# Patient Record
Sex: Female | Born: 1955 | Race: White | Hispanic: No | Marital: Married | Smoking: Never smoker
Health system: Southern US, Community
[De-identification: ages and names within clinical notes are randomized; demographics above are authoritative.]

## PROBLEM LIST (undated history)

## (undated) DIAGNOSIS — K219 Gastro-esophageal reflux disease without esophagitis: Secondary | ICD-10-CM

## (undated) DIAGNOSIS — T7840XA Allergy, unspecified, initial encounter: Secondary | ICD-10-CM

## (undated) DIAGNOSIS — J40 Bronchitis, not specified as acute or chronic: Secondary | ICD-10-CM

## (undated) HISTORY — PX: NO PAST SURGERIES: SHX2092

## (undated) HISTORY — DX: Allergy, unspecified, initial encounter: T78.40XA

## (undated) HISTORY — PX: CHOLECYSTECTOMY: SHX55

## (undated) HISTORY — PX: ANKLE SURGERY: SHX546

## (undated) HISTORY — DX: Gastro-esophageal reflux disease without esophagitis: K21.9

## (undated) HISTORY — PX: TONSILECTOMY, ADENOIDECTOMY, BILATERAL MYRINGOTOMY AND TUBES: SHX2538

---

## 1973-12-19 HISTORY — PX: TONSILLECTOMY: SUR1361

## 2010-12-19 HISTORY — PX: ANKLE SURGERY: SHX546

## 2019-03-04 ENCOUNTER — Ambulatory Visit (INDEPENDENT_AMBULATORY_CARE_PROVIDER_SITE_OTHER): Payer: Managed Care, Other (non HMO) | Admitting: Nurse Practitioner

## 2019-03-04 ENCOUNTER — Encounter: Payer: Self-pay | Admitting: Nurse Practitioner

## 2019-03-04 ENCOUNTER — Other Ambulatory Visit: Payer: Self-pay | Admitting: Nurse Practitioner

## 2019-03-04 ENCOUNTER — Other Ambulatory Visit: Payer: Self-pay

## 2019-03-04 VITALS — BP 120/62 | HR 79 | Resp 15 | Ht 66.0 in | Wt 199.8 lb

## 2019-03-04 DIAGNOSIS — Z7689 Persons encountering health services in other specified circumstances: Secondary | ICD-10-CM

## 2019-03-04 DIAGNOSIS — M79604 Pain in right leg: Secondary | ICD-10-CM

## 2019-03-04 DIAGNOSIS — Z1283 Encounter for screening for malignant neoplasm of skin: Secondary | ICD-10-CM

## 2019-03-04 DIAGNOSIS — N941 Unspecified dyspareunia: Secondary | ICD-10-CM | POA: Diagnosis not present

## 2019-03-04 DIAGNOSIS — B372 Candidiasis of skin and nail: Secondary | ICD-10-CM

## 2019-03-04 DIAGNOSIS — Z23 Encounter for immunization: Secondary | ICD-10-CM

## 2019-03-04 DIAGNOSIS — K219 Gastro-esophageal reflux disease without esophagitis: Secondary | ICD-10-CM | POA: Diagnosis not present

## 2019-03-04 DIAGNOSIS — J40 Bronchitis, not specified as acute or chronic: Secondary | ICD-10-CM

## 2019-03-04 DIAGNOSIS — Z0000 Encounter for general adult medical examination without abnormal findings: Secondary | ICD-10-CM

## 2019-03-04 MED ORDER — ESOMEPRAZOLE MAGNESIUM 40 MG PO CPDR: 40.0000 mg | DELAYED_RELEASE_CAPSULE | Freq: Every day | ORAL | 3 refills | Status: DC

## 2019-03-04 MED ORDER — ESTROGENS, CONJUGATED 0.625 MG/GM VA CREA: TOPICAL_CREAM | VAGINAL | 12 refills | Status: DC

## 2019-03-04 MED ORDER — ALBUTEROL SULFATE HFA 108 (90 BASE) MCG/ACT IN AERS: 1.0000 | INHALATION_SPRAY | Freq: Four times a day (QID) | RESPIRATORY_TRACT | 2 refills | Status: DC | PRN

## 2019-03-04 NOTE — Patient Instructions (Addendum)
Samantha Whitney,   Thank you for coming in to clinic today.  1. Use an antifungal cream Lotrimin topically twice daily for 10-14 days on ears, and right ring finger.  2. STOP PremPro - Take every 3 days this week, then stop.  3. START Premarin vaginal cream fingertip amount nightly.  May reduce to 2-3 time per week if symptoms of vaginal dryness/irritation are controlled.  4. Dermatology office will call you to schedule your screening.  5. Nexium 40 mg once daily before breakfast by mouth.  Please schedule a follow-up appointment with Cassell Smiles, AGNP. Return in about 6 months (around 09/04/2019) for GERD.  If you have any other questions or concerns, please feel free to call the clinic or send a message through [facility]. You may also schedule an earlier appointment if necessary.  You will receive a survey after today's visit either digitally by e-mail or paper by [employer]. Your experiences and feedback matter to [place].  Please respond so we know how we are doing as we provide care for you.   Cassell Smiles, DNP, AGNP-BC Adult Gerontology Nurse Practitioner [place] [facility]

## 2019-03-04 NOTE — Progress Notes (Signed)
Subjective:    Patient ID: Samantha Whitney, female    DOB: April 16, 1956, 63 y.o.   MRN: [id number]  Samantha Whitney is a 63 y.o. female presenting on 03/04/2019 for Establish Care and right leg pain (02/01/19, patient reports she flies a lot for job. )  HPI [facility] Provider Pt last seen by PCP 2-3 years ago.  Obtain records if needed in future.    RIGHT Leg pain Patient travels frequently for her job at [employer] and had traveled 1 week prior to this event.  Patient started with daily aspirin after this for prior symptoms.  Has had no recurrences.  Has been without symptoms since last trips.  - Recent Travel - last trip about 4 days ago.  No symptoms of URI currently.  GERD - She is not currently symptomatic. Patient has worsening symptoms with trigger foods, so she avoids these. - She reports no n/v, coffee ground emesis, dark/black/tarry stool, BRBPR, or other GI bleeding.  - She is currently taking esomeprazole OTC. - Risk factors present for GERD include obesity.  - No prior EGD.  Bronchitis History: - Patient has had bronchitis with regular need for Zpack and occasional steroid.  Requests consideration of history for future treatment. Previous provider would prescribe this often without OV as patient's symptoms would progress quickly without it per patient report.  Past Medical History:  Diagnosis Date  . Allergy   . GERD (gastroesophageal reflux disease)    Past Surgical History:  Procedure Laterality Date  . NO PAST SURGERIES     Social History   Socioeconomic History  . Marital status: Married    Spouse name: Not on file  . Number of children: Not on file  . Years of education: Not on file  . Highest education level: Bachelor's degree (e.g., BA, AB, BS)  Occupational History    Employer: [employer]  Social Needs  . Financial resource strain: Not on file  . Food insecurity:    Worry: Not on file    Inability: Not on file  . Transportation needs:   Medical: Not on file    Non-medical: Not on file  Tobacco Use  . Smoking status: Never Smoker  . Smokeless tobacco: Never Used  Substance and Sexual Activity  . Alcohol use: Not on file  . Drug use: Never  . Sexual activity: Not on file  Lifestyle  . Physical activity:    Days per week: Not on file    Minutes per session: Not on file  . Stress: Not on file  Relationships  . Social connections:    Talks on phone: Not on file    Gets together: Not on file    Attends religious service: Not on file    Active member of club or organization: Not on file    Attends meetings of clubs or organizations: Not on file    Relationship status: Not on file  . Intimate partner violence:    Fear of current or ex partner: Not on file    Emotionally abused: Not on file    Physically abused: Not on file    Forced sexual activity: Not on file  Other Topics Concern  . Not on file  Social History Narrative  . Not on file   Family History  Problem Relation Age of Onset  . Cancer Mother        ColoRectal  . Cancer Father        Stomach Cancer   Current Outpatient  Medications on File Prior to Visit  Medication Sig  . calcium carbonate (OSCAL) 1500 (600 Ca) MG TABS tablet Take by mouth 2 (two) times daily with a meal.  . naproxen sodium (ALEVE) 220 MG tablet Take 220 mg by mouth daily as needed.   No current facility-administered medications on file prior to visit.    Review of Systems  Constitutional: Negative for activity change, appetite change and fatigue.  HENT: Negative for congestion.   Eyes: Negative for visual disturbance.  Respiratory: Negative for cough and shortness of breath.   Cardiovascular: Negative for chest pain, palpitations and leg swelling.  Gastrointestinal: Negative for constipation, diarrhea, nausea and vomiting.  Endocrine: Negative for cold intolerance, heat intolerance, polyphagia and polyuria.  Genitourinary: Negative for dysuria, frequency and urgency.   Musculoskeletal: Negative for arthralgias and myalgias.  Skin: Positive for rash (fingers - pruritic, flaky, erythematous).  Allergic/Immunologic: Negative for environmental allergies and food allergies.  Neurological: Negative for dizziness and headaches.  Psychiatric/Behavioral: Negative for dysphoric mood. The patient is not nervous/anxious.    Per HPI unless specifically indicated above     Objective:    BP 120/62 (BP Location: Left Arm, Patient Position: Sitting, Cuff Size: Normal)   Pulse 79   Resp 15   Ht 5\' 6"  (1.676 m)   Wt 199 lb 12.8 oz (90.6 kg)   SpO2 97%   BMI 32.25 kg/m   Wt Readings from Last 3 Encounters:  03/04/19 199 lb 12.8 oz (90.6 kg)    Physical Exam Vitals signs reviewed.  Constitutional:      General: She is not in acute distress.    Appearance: She is well-developed.  HENT:     Head: Normocephalic and atraumatic.  Cardiovascular:     Rate and Rhythm: Normal rate and regular rhythm.     Pulses:          Radial pulses are 2+ on the right side and 2+ on the left side.       Posterior tibial pulses are 1+ on the right side and 1+ on the left side.     Heart sounds: Normal heart sounds, S1 normal and S2 normal.  Pulmonary:     Effort: Pulmonary effort is normal. No respiratory distress.     Breath sounds: Normal breath sounds and air entry.  Abdominal:     General: Bowel sounds are normal. There is no distension.     Palpations: Abdomen is soft.     Tenderness: There is no abdominal tenderness.     Hernia: No hernia is present.  Musculoskeletal:     Right lower leg: No edema.     Left lower leg: No edema.  Skin:    General: Skin is warm and dry.     Capillary Refill: Capillary refill takes less than 2 seconds.  Neurological:     General: No focal deficit present.     Mental Status: She is alert and oriented to person, place, and time. Mental status is at baseline.  Psychiatric:        Attention and Perception: Attention normal.        Mood  and Affect: Mood and affect normal.        Behavior: Behavior normal. Behavior is cooperative.        Thought Content: Thought content normal.        Judgment: Judgment normal.       Assessment & Plan:   Problem List Items Addressed This Visit  None    Visit Diagnoses    Gastroesophageal reflux disease, esophagitis presence not specified    -  Primary Currently well controlled on esomeprazole 40 mg once daily.  Plan: 1. Continue esomeprazole 40 mg once daily. Side effects discussed. Pt wants to continue med. 2. Avoid diet triggers. Reviewed need to seek care if globus sensation, difficulty swallowing, s/sx of GI bleed. 3. Follow up as needed and in 6 months.    Relevant Medications   esomeprazole (NEXIUM) 40 MG capsule   Right leg pain     Resolved.  Unlikely DVT due to no consistent symptoms with DVT.  Pennix score 0.  Encouraged foot/leg exercises during flight, regular walking.  Likely was muscle strain in past.  Increased risk on systemic HRT at advancing age.  Follow-up prn.    Dyspareunia in female     Patient with pain on intercourse.  Needs to stop HRT.  Patient's biggest reason for continuing HRT is dyspareunia.  Change to premarin cream fingertip amount 2-7 times per week as needed at opening of vagina.   Relevant Medications   conjugated estrogens (PREMARIN) vaginal cream   Candidal skin infection     Mild infection.  Likely candidal.  USE OTC Lotrimin bid x 7-10 days.  Can offer Rx if not resolving.  May also consider future dermatology referral.    Skin cancer screening       Relevant Orders   Ambulatory referral to Dermatology   Bronchitis     Patient without albuterol.  Refill sent. Discussed would likely try prednisone in future for acute flares prior to antibiotics.  Patient reluctant, but verbalizes understanding.  No acute problem today.   Relevant Medications   albuterol (PROVENTIL HFA;VENTOLIN HFA) 108 (90 Base) MCG/ACT inhaler   Flu vaccine need        Relevant Orders   Flu Vaccine QUAD 36+ mos IM (Completed)      Meds ordered this encounter  Medications  . esomeprazole (NEXIUM) 40 MG capsule    Sig: Take 1 capsule (40 mg total) by mouth daily.    Dispense:  90 capsule    Refill:  3    Order Specific Question:   Supervising Provider    Answer:   Olin Hauser [[id number]]  . conjugated estrogens (PREMARIN) vaginal cream    Sig: Use a fingertip amount at opening of vagina nightly for vaginal dryness.    Dispense:  42.5 g    Refill:  12    Order Specific Question:   Supervising Provider    Answer:   Olin Hauser [[id number]]  . albuterol (PROVENTIL HFA;VENTOLIN HFA) 108 (90 Base) MCG/ACT inhaler    Sig: Inhale 1-2 puffs into the lungs every 6 (six) hours as needed for shortness of breath.    Dispense:  1 Inhaler    Refill:  2    Product selection permitted for insurance/patient brand preference    Order Specific Question:   Supervising Provider    Answer:   Olin Hauser [[id number]]   Follow up plan: Return in about 6 months (around 09/04/2019) for GERD.  Cassell Smiles, DNP, AGPCNP-BC Adult Gerontology Primary Care Nurse Practitioner [facility] 03/04/2019, 10:48 AM

## 2019-03-08 LAB — COMPREHENSIVE METABOLIC PANEL
ALT: 34 IU/L — ABNORMAL HIGH (ref 0–32)
AST: 31 IU/L (ref 0–40)
Albumin/Globulin Ratio: 1.7 (ref 1.2–2.2)
Albumin: 4.6 g/dL (ref 3.8–4.8)
Alkaline Phosphatase: 47 IU/L (ref 39–117)
BUN/Creatinine Ratio: 29 — ABNORMAL HIGH (ref 12–28)
BUN: 25 mg/dL (ref 8–27)
Bilirubin Total: 0.3 mg/dL (ref 0.0–1.2)
CO2: 23 mmol/L (ref 20–29)
Calcium: 9.5 mg/dL (ref 8.7–10.3)
Chloride: 102 mmol/L (ref 96–106)
Creatinine, Ser: 0.86 mg/dL (ref 0.57–1.00)
GFR calc Af Amer: 83 mL/min/{1.73_m2} (ref >59)
GFR calc non Af Amer: 72 mL/min/{1.73_m2} (ref >59)
Globulin, Total: 2.7 g/dL (ref 1.5–4.5)
Glucose: 99 mg/dL (ref 65–99)
Potassium: 4.9 mmol/L (ref 3.5–5.2)
Sodium: 143 mmol/L (ref 134–144)
Total Protein: 7.3 g/dL (ref 6.0–8.5)

## 2019-03-08 LAB — VITAMIN D 25 HYDROXY (VIT D DEFICIENCY, FRACTURES): Vit D, 25-Hydroxy: 28.6 ng/mL — ABNORMAL LOW (ref 30.0–100.0)

## 2019-03-08 LAB — CBC WITH DIFFERENTIAL/PLATELET
Basophils Absolute: 0.1 10*3/uL (ref 0.0–0.2)
Basos: 1 %
EOS (ABSOLUTE): 0.4 10*3/uL (ref 0.0–0.4)
Eos: 7 %
Hematocrit: 40.1 % (ref 34.0–46.6)
Hemoglobin: 13.3 g/dL (ref 11.1–15.9)
Immature Grans (Abs): 0 10*3/uL (ref 0.0–0.1)
Immature Granulocytes: 0 %
Lymphocytes Absolute: 1.5 10*3/uL (ref 0.7–3.1)
Lymphs: 26 %
MCH: 30.6 pg (ref 26.6–33.0)
MCHC: 33.2 g/dL (ref 31.5–35.7)
MCV: 92 fL (ref 79–97)
Monocytes Absolute: 0.5 10*3/uL (ref 0.1–0.9)
Monocytes: 9 %
Neutrophils Absolute: 3.2 10*3/uL (ref 1.4–7.0)
Neutrophils: 57 %
Platelets: 272 10*3/uL (ref 150–450)
RBC: 4.35 x10E6/uL (ref 3.77–5.28)
RDW: 12.7 % (ref 11.7–15.4)
WBC: 5.7 10*3/uL (ref 3.4–10.8)

## 2019-03-08 LAB — LIPID PANEL
Chol/HDL Ratio: 3.3 ratio (ref 0.0–4.4)
Cholesterol, Total: 235 mg/dL — ABNORMAL HIGH (ref 100–199)
HDL: 71 mg/dL (ref >39)
LDL Calculated: 132 mg/dL — ABNORMAL HIGH (ref 0–99)
Triglycerides: 158 mg/dL — ABNORMAL HIGH (ref 0–149)
VLDL Cholesterol Cal: 32 mg/dL (ref 5–40)

## 2019-03-08 LAB — TSH: TSH: 4.54 u[IU]/mL — ABNORMAL HIGH (ref 0.450–4.500)

## 2019-03-08 LAB — HEMOGLOBIN A1C
Est. average glucose Bld gHb Est-mCnc: 111 mg/dL
Hgb A1c MFr Bld: 5.5 % (ref 4.8–5.6)

## 2019-03-11 ENCOUNTER — Encounter: Payer: Self-pay | Admitting: Nurse Practitioner

## 2019-03-11 ENCOUNTER — Other Ambulatory Visit: Payer: Self-pay

## 2019-03-11 ENCOUNTER — Ambulatory Visit (INDEPENDENT_AMBULATORY_CARE_PROVIDER_SITE_OTHER): Payer: Managed Care, Other (non HMO) | Admitting: Nurse Practitioner

## 2019-03-11 ENCOUNTER — Other Ambulatory Visit: Payer: Self-pay | Admitting: Nurse Practitioner

## 2019-03-11 ENCOUNTER — Other Ambulatory Visit (HOSPITAL_COMMUNITY)
Admission: RE | Admit: 2019-03-11 | Discharge: 2019-03-11 | Disposition: A | Discharge status: Home / Self Care | Payer: Managed Care, Other (non HMO) | Source: Ambulatory Visit | Attending: Nurse Practitioner | Admitting: Nurse Practitioner

## 2019-03-11 VITALS — BP 116/58 | HR 80 | Temp 98.1°F | Ht 66.0 in | Wt 200.6 lb

## 2019-03-11 DIAGNOSIS — Z0000 Encounter for general adult medical examination without abnormal findings: Secondary | ICD-10-CM | POA: Insufficient documentation

## 2019-03-11 DIAGNOSIS — R7989 Other specified abnormal findings of blood chemistry: Secondary | ICD-10-CM

## 2019-03-11 DIAGNOSIS — Z23 Encounter for immunization: Secondary | ICD-10-CM | POA: Diagnosis not present

## 2019-03-11 DIAGNOSIS — Z800 Family history of malignant neoplasm of digestive organs: Secondary | ICD-10-CM

## 2019-03-11 DIAGNOSIS — Z124 Encounter for screening for malignant neoplasm of cervix: Secondary | ICD-10-CM

## 2019-03-11 DIAGNOSIS — Z1231 Encounter for screening mammogram for malignant neoplasm of breast: Secondary | ICD-10-CM

## 2019-03-11 DIAGNOSIS — B372 Candidiasis of skin and nail: Secondary | ICD-10-CM

## 2019-03-11 MED ORDER — CLOTRIMAZOLE-BETAMETHASONE 1-0.05 % EX CREA: 1.0000 "application " | TOPICAL_CREAM | Freq: Two times a day (BID) | CUTANEOUS | 0 refills | Status: AC

## 2019-03-11 NOTE — Progress Notes (Signed)
Patient has tried Prevacid, Protonix, Zantac.  Has had no relief except with Nexium in past. Does need to take 40 mg daily.

## 2019-03-11 NOTE — Patient Instructions (Addendum)
Sabino Niemann,   Thank you for coming in to clinic today.  1. Labs are reviewed.  Continue work on healthy lifestyle.  We will add on Free T4 to further check your thyroid function.  2. Your mammogram and bone density orders has been placed.  Call the Scheduling phone number at [phone] to schedule your tests at your convenience.  You can choose to go to either location listed below.  Let the scheduler know which location you prefer.  [place]  [address], [place] [address]   [facility] [address] [place], [place] [address]   3. Lotrisone is at your pharmacy - apply twice daily for 14 days.  -  We will work on approval for your Prevacid.  Please schedule a follow-up appointment with Cassell Smiles, AGNP. Return in about 1 year (around 03/10/2020) for annual physical.  If you have any other questions or concerns, please feel free to call the clinic or send a message through [facility]. You may also schedule an earlier appointment if necessary.  You will receive a survey after today's visit either digitally by e-mail or paper by [employer]. Your experiences and feedback matter to [place].  Please respond so we know how we are doing as we provide care for you.   Cassell Smiles, DNP, AGNP-BC Adult Gerontology Nurse Practitioner [place]

## 2019-03-11 NOTE — Progress Notes (Signed)
Subjective:    Patient ID: Samantha Whitney, female    DOB: 05/20/1956, 63 y.o.   MRN: [id number]  Samantha Whitney is a 63 y.o. female presenting on 03/11/2019 for Annual Exam   HPI Annual Physical Exam  Patient has been feeling well.  They have no acute concerns today. Sleeps 7-8 hours per night uninterrupted.  HEALTH MAINTENANCE: Weight/BMI: stable to gradually increasing Physical activity: walking regularly Diet: generally healthy Seatbelt: always Sunscreen: regular PAP: due Mammogram: due DEXA: last 2018, normal density in past.  Takes Vitamin D/Calcium Caltrate.  Colon Cancer Screen: Last 2018, Needs records - [place] - would follow-up GI annually in past. Possibly every 3 years. HIV/HEP C: due Optometry: not established Dentistry: not established  VACCINES: Tetanus: Last unknown - due today Influenza: received Shingles: due - recommended  Social History   Tobacco Use  . Smoking status: Never Smoker  . Smokeless tobacco: Never Used  Substance Use Topics  . Alcohol use: Yes    Alcohol/week: 2.0 standard drinks    Types: 2 Glasses of wine per week    Comment: weekly  . Drug use: Never    Review of Systems  Constitutional: Negative for activity change, appetite change and fatigue.  Respiratory: Negative for cough and shortness of breath.   Cardiovascular: Negative for chest pain, palpitations and leg swelling.  Gastrointestinal: Negative for constipation, diarrhea, nausea and vomiting.  Genitourinary: Negative for dysuria, frequency and urgency.  Musculoskeletal: Negative for arthralgias and myalgias.  Skin: Negative for rash.  Neurological: Negative for dizziness and headaches.  Psychiatric/Behavioral: Negative for dysphoric mood. The patient is not nervous/anxious.    Per HPI unless specifically indicated above     Objective:    BP (!) 116/58 (BP Location: Left Arm, Patient Position: Sitting, Cuff Size: Large)   Pulse 80   Temp 98.1 F (36.7 C)  (Oral)   Ht 5\' 6"  (1.676 m)   Wt 200 lb 9.6 oz (91 kg)   BMI 32.38 kg/m   Wt Readings from Last 3 Encounters:  03/11/19 200 lb 9.6 oz (91 kg)  03/04/19 199 lb 12.8 oz (90.6 kg)    Physical Exam Vitals signs and nursing note reviewed.  Constitutional:      General: She is not in acute distress.    Appearance: She is well-developed.  HENT:     Head: Normocephalic and atraumatic.     Right Ear: External ear normal.     Left Ear: External ear normal.     Nose: Nose normal.  Eyes:     Conjunctiva/sclera: Conjunctivae normal.     Pupils: Pupils are equal, round, and reactive to light.  Neck:     Musculoskeletal: Normal range of motion and neck supple.     Thyroid: No thyromegaly.     Vascular: No JVD.     Trachea: No tracheal deviation.  Cardiovascular:     Rate and Rhythm: Normal rate and regular rhythm.     Heart sounds: Normal heart sounds. No murmur. No friction rub. No gallop.   Pulmonary:     Effort: Pulmonary effort is normal. No respiratory distress.     Breath sounds: Normal breath sounds.  Chest:     Comments: Breast - Normal exam w/ symmetric breasts, no mass, no nipple discharge, no skin changes or tenderness.  Abdominal:     General: Bowel sounds are normal. There is no distension.     Palpations: Abdomen is soft.     Tenderness: There is no abdominal  tenderness.  Genitourinary:    Comments: Normal external female genitalia without lesions or fusion. Vaginal canal without lesions. Normal appearing cervix without lesions or friability. Mild atrophic changes noted throughout genitalia.  Physiologic discharge on exam. Bimanual exam without adnexal masses, enlarged uterus, or cervical motion tenderness. Musculoskeletal: Normal range of motion.  Lymphadenopathy:     Cervical: No cervical adenopathy.  Skin:    General: Skin is warm and dry.  Neurological:     Mental Status: She is alert and oriented to person, place, and time.     Cranial Nerves: No cranial nerve  deficit.  Psychiatric:        Behavior: Behavior normal.        Thought Content: Thought content normal.        Judgment: Judgment normal.    Results for orders placed or performed in visit on 03/11/19  Cytology - PAP  Result Value Ref Range   Adequacy      Satisfactory for evaluation  endocervical/transformation zone component PRESENT.   Diagnosis      NEGATIVE FOR INTRAEPITHELIAL LESIONS OR MALIGNANCY.   HPV NOT DETECTED    Material Submitted CervicoVaginal Pap [ThinPrep Imaged]   Cervicovaginal ancillary only  Result Value Ref Range   Chlamydia Negative    Neisseria gonorrhea Negative       Assessment & Plan:   Problem List Items Addressed This Visit    None    Visit Diagnoses    Encounter for annual physical exam    -  Primary   Relevant Orders   MM DIGITAL SCREENING BILATERAL   Ambulatory referral to Gastroenterology   Varicella-zoster vaccine IM (Shingrix) (Completed)   Cervicovaginal ancillary only (Completed)   Need for diphtheria-tetanus-pertussis (Tdap) vaccine       Relevant Orders   Tdap vaccine greater than or equal to 7yo IM (Completed)   Elevated TSH       Relevant Orders   T4, free   Need for Tdap vaccination       Need for shingles vaccine       Relevant Orders   Varicella-zoster vaccine IM (Shingrix) (Completed)   Family history of gastric cancer       Relevant Orders   Ambulatory referral to Gastroenterology   Breast cancer screening by mammogram       Relevant Orders   MM DIGITAL SCREENING BILATERAL   Cervical cancer screening       Relevant Orders   Cytology - PAP (Completed)   Cervicovaginal ancillary only (Completed)     Annual physical exam with no new findings.  Well adult with no acute concerns.  Plan: 1. Obtain health maintenance screenings as above according to age. - Increase physical activity to 30 minutes most days of the week.  - Eat healthy diet high in vegetables and fruits; low in refined carbohydrates. - Screening labs  and tests as ordered - Vaccines recommended - administer Tdap, shingrix today. 2. Return 1 year for annual physical.     Follow up plan: Return in about 1 year (around 03/10/2020) for annual physical.  Cassell Smiles, DNP, AGPCNP-BC Adult Gerontology Primary Care Nurse Practitioner [facility] 03/11/2019, 10:14 AM

## 2019-03-12 ENCOUNTER — Other Ambulatory Visit: Payer: Self-pay

## 2019-03-12 LAB — CERVICOVAGINAL ANCILLARY ONLY
Chlamydia: NEGATIVE
Neisseria Gonorrhea: NEGATIVE

## 2019-03-13 LAB — CYTOLOGY - PAP
Diagnosis: NEGATIVE
HPV: NOT DETECTED

## 2019-03-14 ENCOUNTER — Encounter: Payer: Self-pay | Admitting: Nurse Practitioner

## 2019-03-15 LAB — T4, FREE: Free T4: 0.95 ng/dL (ref 0.82–1.77)

## 2019-03-15 LAB — SPECIMEN STATUS REPORT

## 2019-04-24 ENCOUNTER — Encounter: Payer: Self-pay | Admitting: *Deleted

## 2019-09-05 ENCOUNTER — Other Ambulatory Visit: Payer: Self-pay | Admitting: Nurse Practitioner

## 2019-09-05 DIAGNOSIS — K219 Gastro-esophageal reflux disease without esophagitis: Secondary | ICD-10-CM

## 2019-11-01 ENCOUNTER — Other Ambulatory Visit: Payer: Self-pay

## 2019-11-01 ENCOUNTER — Ambulatory Visit: Payer: Self-pay | Admitting: Family Medicine

## 2019-11-01 ENCOUNTER — Ambulatory Visit (INDEPENDENT_AMBULATORY_CARE_PROVIDER_SITE_OTHER): Payer: Managed Care, Other (non HMO)

## 2019-11-01 DIAGNOSIS — Z23 Encounter for immunization: Secondary | ICD-10-CM

## 2020-02-08 ENCOUNTER — Ambulatory Visit: Payer: Managed Care, Other (non HMO) | Attending: Internal Medicine

## 2020-02-08 DIAGNOSIS — Z23 Encounter for immunization: Secondary | ICD-10-CM | POA: Insufficient documentation

## 2020-02-08 NOTE — Progress Notes (Signed)
   Covid-19 Vaccination Clinic  Name:  Lakeria Knabel    MRN: [id number] DOB: 1955-12-23  02/08/2020  Ms. Hemberger was observed post Covid-19 immunization for 15 minutes without incidence. She was provided with Vaccine Information Sheet and instruction to access the V-Safe system.   Ms. Giegerich was instructed to call 911 with any severe reactions post vaccine: Marland Kitchen Difficulty breathing  . Swelling of your face and throat  . A fast heartbeat  . A bad rash all over your body  . Dizziness and weakness    Immunizations Administered    Name Date Dose VIS Date Route   Pfizer COVID-19 Vaccine 02/08/2020  3:05 PM 0.3 mL 11/29/2019 Intramuscular   Manufacturer: [place]   Lot: [id number]   [place]: [id number]

## 2020-03-03 ENCOUNTER — Other Ambulatory Visit: Payer: Self-pay

## 2020-03-03 ENCOUNTER — Ambulatory Visit: Payer: Managed Care, Other (non HMO) | Attending: Internal Medicine

## 2020-03-03 DIAGNOSIS — K219 Gastro-esophageal reflux disease without esophagitis: Secondary | ICD-10-CM

## 2020-03-03 DIAGNOSIS — Z23 Encounter for immunization: Secondary | ICD-10-CM

## 2020-03-03 MED ORDER — ESOMEPRAZOLE MAGNESIUM 40 MG PO CPDR: 40.0000 mg | DELAYED_RELEASE_CAPSULE | Freq: Every day | ORAL | 0 refills | Status: DC

## 2020-03-03 NOTE — Progress Notes (Signed)
   Covid-19 Vaccination Clinic  Name:  Samantha Whitney    MRN: [id number] DOB: 26-Aug-1956  03/03/2020  Samantha Whitney was observed post Covid-19 immunization for 15 minutes without incident. She was provided with Vaccine Information Sheet and instruction to access the V-Safe system.   Samantha Whitney was instructed to call 911 with any severe reactions post vaccine: Marland Kitchen Difficulty breathing  . Swelling of face and throat  . A fast heartbeat  . A bad rash all over body  . Dizziness and weakness   Immunizations Administered    Name Date Dose VIS Date Route   Pfizer COVID-19 Vaccine 03/03/2020  9:02 AM 0.3 mL 11/29/2019 Intramuscular   Manufacturer: [place]   Lot: [id number]   [place]: [id number]

## 2020-03-05 ENCOUNTER — Other Ambulatory Visit: Payer: Self-pay

## 2020-03-16 ENCOUNTER — Other Ambulatory Visit: Payer: Self-pay

## 2020-03-16 ENCOUNTER — Encounter: Payer: Self-pay | Admitting: Family Medicine

## 2020-03-16 ENCOUNTER — Ambulatory Visit (INDEPENDENT_AMBULATORY_CARE_PROVIDER_SITE_OTHER): Payer: Managed Care, Other (non HMO) | Admitting: Family Medicine

## 2020-03-16 VITALS — BP 102/60 | HR 84 | Temp 97.7°F | Ht 64.0 in | Wt 205.4 lb

## 2020-03-16 DIAGNOSIS — Z1231 Encounter for screening mammogram for malignant neoplasm of breast: Secondary | ICD-10-CM

## 2020-03-16 DIAGNOSIS — Z0000 Encounter for general adult medical examination without abnormal findings: Secondary | ICD-10-CM | POA: Insufficient documentation

## 2020-03-16 DIAGNOSIS — Z1211 Encounter for screening for malignant neoplasm of colon: Secondary | ICD-10-CM

## 2020-03-16 DIAGNOSIS — K219 Gastro-esophageal reflux disease without esophagitis: Secondary | ICD-10-CM

## 2020-03-16 DIAGNOSIS — Z114 Encounter for screening for human immunodeficiency virus [HIV]: Secondary | ICD-10-CM | POA: Diagnosis not present

## 2020-03-16 DIAGNOSIS — Z23 Encounter for immunization: Secondary | ICD-10-CM

## 2020-03-16 DIAGNOSIS — Z13820 Encounter for screening for osteoporosis: Secondary | ICD-10-CM

## 2020-03-16 DIAGNOSIS — Z1159 Encounter for screening for other viral diseases: Secondary | ICD-10-CM

## 2020-03-16 DIAGNOSIS — E559 Vitamin D deficiency, unspecified: Secondary | ICD-10-CM

## 2020-03-16 DIAGNOSIS — R635 Abnormal weight gain: Secondary | ICD-10-CM

## 2020-03-16 MED ORDER — ESOMEPRAZOLE MAGNESIUM 40 MG PO CPDR: 40.0000 mg | DELAYED_RELEASE_CAPSULE | Freq: Every day | ORAL | 1 refills | Status: DC

## 2020-03-16 NOTE — Assessment & Plan Note (Signed)
Annual physical exam without new findings.  Well adult with no acute concerns.  Plan: 1. Obtain health maintenance screenings as above according to age. - Increase physical activity to 30 minutes most days of the week.  - Eat healthy diet high in vegetables and fruits; low in refined carbohydrates. - Screening labs and tests as ordered 2. Return 1 year for annual physical.  

## 2020-03-16 NOTE — Patient Instructions (Signed)
As we discussed, have your labs drawn in the next 1-2 weeks and we will contact you with the results.  For Mammogram screening for breast cancer  DEXA Scan (Bone mineral density) screening for osteoporosis  Call the [facility] below anytime to schedule your own appointment now that order has been placed.  [facility] [employer] [place], [place] [address] Phone: [phone]  [facility] [address] [place], [place] [address] Phone: [phone]  We will plan to see you back in 1 year for your next wellness exam and can schedule a nurse visit for your pneumonia vaccination in the next 1-2 weeks.  You will receive a survey after today's visit either digitally by e-mail or paper by [employer]. Your experiences and feedback matter to [place].  Please respond so we know how we are doing as we provide care for you.  Call us with any questions/concerns/needs.  It is my goal to be available to you for your health concerns.  Thanks for choosing me to be a partner in your healthcare needs!  Harlin Rain, FNP-C Family Nurse Practitioner [facility] Phone: [phone]

## 2020-03-16 NOTE — Progress Notes (Signed)
Subjective:    Patient ID: Samantha Whitney, female    DOB: 1956-07-04, 64 y.o.   MRN: [id number]  Samantha Whitney is a 64 y.o. female presenting on 03/16/2020 for Annual Exam   HPI  HEALTH MAINTENANCE:  [name] presents to clinic for her yearly wellness exam without acute concerns today.  Weight/BMI: 5lb weight gain since last year Physical activity: No structured exercise routine Diet: Regular Seatbelt: Yes Mammogram: Due, ordered today PAP: Completed 02/2019 DEXA: Due, ordered today Colon cancer screening: Due, Ordered GI referral for colonoscopy today HIV & Hep C Screening: Due, ordered today Optometry: Regularly Dentistry: Regularly  IMMUNIZATIONS: Influenza: Completed 11/01/2019 Tetanus: Completed 03/11/2019 Shingles: Completed 03/11/2019, 11/01/2019 COVID: Completed 02/08/2020, 03/03/2020 Pneumonia: Due, is 13 days from 2nd COVID vaccine, will await 14 day waiting period before immunization   Depression screen [facility] 2/9 03/16/2020 03/11/2019  Decreased Interest 0 0  Down, Depressed, Hopeless 0 0  PHQ - 2 Score 0 0    Past Medical History:  Diagnosis Date  . Allergy   . GERD (gastroesophageal reflux disease)    Past Surgical History:  Procedure Laterality Date  . NO PAST SURGERIES     Social History   Socioeconomic History  . Marital status: Married    Spouse name: Not on file  . Number of children: Not on file  . Years of education: Not on file  . Highest education level: Bachelor's degree (e.g., BA, AB, BS)  Occupational History    Employer: [employer]  Tobacco Use  . Smoking status: Never Smoker  . Smokeless tobacco: Never Used  Substance and Sexual Activity  . Alcohol use: Yes    Alcohol/week: 2.0 standard drinks    Types: 2 Glasses of wine per week    Comment: weekly  . Drug use: Never  . Sexual activity: Not on file  Other Topics Concern  . Not on file  Social History Narrative  . Not on file   Social Determinants of Health    Financial Resource Strain:   . Difficulty of Paying Living Expenses:   Food Insecurity:   . Worried About Charity fundraiser in the Last Year:   . Arboriculturist in the Last Year:   Transportation Needs:   . Film/video editor (Medical):   Marland Kitchen Lack of Transportation (Non-Medical):   Physical Activity:   . Days of Exercise per Week:   . Minutes of Exercise per Session:   Stress:   . Feeling of Stress :   Social Connections:   . Frequency of Communication with Friends and Family:   . Frequency of Social Gatherings with Friends and Family:   . Attends Religious Services:   . Active Member of Clubs or Organizations:   . Attends Archivist Meetings:   Marland Kitchen Marital Status:   Intimate Partner Violence:   . Fear of Current or Ex-Partner:   . Emotionally Abused:   Marland Kitchen Physically Abused:   . Sexually Abused:    Family History  Problem Relation Age of Onset  . Cancer Mother        ColoRectal  . Cancer Father        Stomach Cancer   Current Outpatient Medications on File Prior to Visit  Medication Sig  . albuterol (PROVENTIL HFA;VENTOLIN HFA) 108 (90 Base) MCG/ACT inhaler Inhale 1-2 puffs into the lungs every 6 (six) hours as needed for shortness of breath.  . calcium carbonate (OSCAL) 1500 (600 Ca) MG TABS tablet Take  by mouth 2 (two) times daily with a meal.  . conjugated estrogens (PREMARIN) vaginal cream Use a fingertip amount at opening of vagina nightly for vaginal dryness.  . naproxen sodium (ALEVE) 220 MG tablet Take 220 mg by mouth daily as needed.   No current facility-administered medications on file prior to visit.    Per HPI unless specifically indicated above     Objective:    BP 102/60   Pulse 84   Temp 97.7 F (36.5 C)   Ht 5' 4"  (1.626 m)   Wt 205 lb 6.4 oz (93.2 kg)   SpO2 100%   BMI 35.26 kg/m   Wt Readings from Last 3 Encounters:  03/16/20 205 lb 6.4 oz (93.2 kg)  03/11/19 200 lb 9.6 oz (91 kg)  03/04/19 199 lb 12.8 oz (90.6 kg)     Physical Exam Vitals reviewed.  Constitutional:      General: She is not in acute distress.    Appearance: Normal appearance. She is well-developed and well-groomed. She is obese. She is not ill-appearing or toxic-appearing.  HENT:     Head: Normocephalic.     Right Ear: Tympanic membrane, ear canal and external ear normal. There is no impacted cerumen.     Left Ear: Tympanic membrane, ear canal and external ear normal. There is no impacted cerumen.     Nose: Nose normal. No congestion or rhinorrhea.     Mouth/Throat:     Lips: Pink.     Mouth: Mucous membranes are moist.     Pharynx: Oropharynx is clear. Uvula midline. No oropharyngeal exudate or posterior oropharyngeal erythema.  Eyes:     General: Lids are normal. Vision grossly intact. No scleral icterus.       Right eye: No discharge.        Left eye: No discharge.     Extraocular Movements: Extraocular movements intact.     Conjunctiva/sclera: Conjunctivae normal.     Pupils: Pupils are equal, round, and reactive to light.  Neck:     Thyroid: No thyroid mass or thyromegaly.  Cardiovascular:     Rate and Rhythm: Normal rate and regular rhythm.     Pulses: Normal pulses.          Dorsalis pedis pulses are 2+ on the right side and 2+ on the left side.       Posterior tibial pulses are 2+ on the right side and 2+ on the left side.     Heart sounds: Normal heart sounds. No murmur. No friction rub. No gallop.   Pulmonary:     Effort: Pulmonary effort is normal. No respiratory distress.     Breath sounds: Normal breath sounds.  Abdominal:     General: Abdomen is flat. Bowel sounds are normal. There is no distension.     Palpations: Abdomen is soft. There is no hepatomegaly, splenomegaly or mass.     Tenderness: There is no abdominal tenderness. There is no guarding or rebound.     Hernia: No hernia is present.  Musculoskeletal:        General: Normal range of motion.     Cervical back: Normal range of motion and neck supple.  No tenderness.     Right lower leg: No edema.     Left lower leg: No edema.  Feet:     Right foot:     Skin integrity: Skin integrity normal.     Left foot:     Skin integrity: Skin integrity normal.  Lymphadenopathy:     Cervical: No cervical adenopathy.  Skin:    General: Skin is warm and dry.     Capillary Refill: Capillary refill takes less than 2 seconds.  Neurological:     General: No focal deficit present.     Mental Status: She is alert and oriented to person, place, and time.     Cranial Nerves: No cranial nerve deficit.     Sensory: No sensory deficit.     Motor: No weakness.     Coordination: Coordination normal.     Gait: Gait normal.     Deep Tendon Reflexes: Reflexes normal.  Psychiatric:        Attention and Perception: Attention and perception normal.        Mood and Affect: Mood and affect normal.        Speech: Speech normal.        Behavior: Behavior normal. Behavior is cooperative.        Thought Content: Thought content normal.        Cognition and Memory: Cognition and memory normal.        Judgment: Judgment normal.     Results for orders placed or performed in visit on 03/11/19  Cytology - PAP  Result Value Ref Range   Adequacy      Satisfactory for evaluation  endocervical/transformation zone component PRESENT.   Diagnosis      NEGATIVE FOR INTRAEPITHELIAL LESIONS OR MALIGNANCY.   HPV NOT DETECTED    Material Submitted CervicoVaginal Pap [ThinPrep Imaged]   Cervicovaginal ancillary only  Result Value Ref Range   Chlamydia Negative    Neisseria Gonorrhea Negative       Assessment & Plan:   Problem List Items Addressed This Visit      Digestive   GERD (gastroesophageal reflux disease)    Currently well controlled on esomeprazole 28m once daily.  Plan: 1. Continue esomeprazole 468monce daily. Side effects discussed. Pt wants to continue med. 2. Avoid diet triggers. Reviewed need to seek care if globus sensation, difficulty swallowing,  s/sx of GI bleed. 3. Follow up as needed and in 1 year.       Relevant Medications   esomeprazole (NEXIUM) 40 MG capsule     Other   Routine medical exam    Annual physical exam without new findings.  Well adult with no acute concerns.  Plan: 1. Obtain health maintenance screenings as above according to age. - Increase physical activity to 30 minutes most days of the week.  - Eat healthy diet high in vegetables and fruits; low in refined carbohydrates. - Screening labs and tests as ordered 2. Return 1 year for annual physical.       Relevant Orders   CBC with Differential   CMP14+EGFR    Other Visit Diagnoses    Need for pneumococcal vaccine    -  Primary   Screening for HIV (human immunodeficiency virus)       Relevant Orders   HIV antibody (with reflex)   Encounter for hepatitis C screening test for low risk patient       Relevant Orders   Hepatitis C Antibody   Weight gain       Relevant Orders   Lipid Profile   Thyroid Panel With TSH   HgB A1c   Vitamin D deficiency       Relevant Orders   VITAMIN D 25 Hydroxy (Vit-D Deficiency, Fractures)   Screening for colon cancer  Relevant Orders   Ambulatory referral to Gastroenterology   Encounter for screening mammogram for malignant neoplasm of breast       Relevant Orders   MM 3D SCREEN BREAST BILATERAL   Screening for osteoporosis       Relevant Orders   DG Bone Density      Meds ordered this encounter  Medications  . esomeprazole (NEXIUM) 40 MG capsule    Sig: Take 1 capsule (40 mg total) by mouth daily.    Dispense:  90 capsule    Refill:  1      Follow up plan: Return in about 1 year (around 03/16/2021) for CPE.  Harlin Rain, FNP-C Family Nurse Practitioner [facility] 03/16/2020, 10:16 AM

## 2020-03-16 NOTE — Assessment & Plan Note (Signed)
Currently well controlled on esomeprazole 40mg  once daily.  Plan: 1. Continue esomeprazole 40mg  once daily. Side effects discussed. Pt wants to continue med. 2. Avoid diet triggers. Reviewed need to seek care if globus sensation, difficulty swallowing, s/sx of GI bleed. 3. Follow up as needed and in 1 year.

## 2020-03-31 ENCOUNTER — Other Ambulatory Visit: Payer: Self-pay | Admitting: Nurse Practitioner

## 2020-03-31 DIAGNOSIS — N941 Unspecified dyspareunia: Secondary | ICD-10-CM

## 2020-04-01 ENCOUNTER — Other Ambulatory Visit: Payer: Self-pay

## 2020-04-01 DIAGNOSIS — N941 Unspecified dyspareunia: Secondary | ICD-10-CM

## 2020-04-01 MED ORDER — PREMARIN 0.625 MG/GM VA CREA: TOPICAL_CREAM | VAGINAL | 12 refills | Status: DC

## 2020-07-09 LAB — CMP14+EGFR
ALT: 17 IU/L (ref 0–32)
AST: 21 IU/L (ref 0–40)
Albumin/Globulin Ratio: 1.6 (ref 1.2–2.2)
Albumin: 4.5 g/dL (ref 3.8–4.8)
Alkaline Phosphatase: 54 IU/L (ref 48–121)
BUN/Creatinine Ratio: 22 (ref 12–28)
BUN: 17 mg/dL (ref 8–27)
Bilirubin Total: 0.3 mg/dL (ref 0.0–1.2)
CO2: 24 mmol/L (ref 20–29)
Calcium: 9.6 mg/dL (ref 8.7–10.3)
Chloride: 103 mmol/L (ref 96–106)
Creatinine, Ser: 0.79 mg/dL (ref 0.57–1.00)
GFR calc Af Amer: 91 mL/min/{1.73_m2} (ref >59)
GFR calc non Af Amer: 79 mL/min/{1.73_m2} (ref >59)
Globulin, Total: 2.9 g/dL (ref 1.5–4.5)
Glucose: 100 mg/dL — ABNORMAL HIGH (ref 65–99)
Potassium: 4.5 mmol/L (ref 3.5–5.2)
Sodium: 141 mmol/L (ref 134–144)
Total Protein: 7.4 g/dL (ref 6.0–8.5)

## 2020-07-09 LAB — CBC WITH DIFFERENTIAL/PLATELET
Basophils Absolute: 0.1 10*3/uL (ref 0.0–0.2)
Basos: 1 %
EOS (ABSOLUTE): 0.1 10*3/uL (ref 0.0–0.4)
Eos: 3 %
Hematocrit: 38.8 % (ref 34.0–46.6)
Hemoglobin: 12.7 g/dL (ref 11.1–15.9)
Immature Grans (Abs): 0 10*3/uL (ref 0.0–0.1)
Immature Granulocytes: 0 %
Lymphocytes Absolute: 1.4 10*3/uL (ref 0.7–3.1)
Lymphs: 26 %
MCH: 29.6 pg (ref 26.6–33.0)
MCHC: 32.7 g/dL (ref 31.5–35.7)
MCV: 90 fL (ref 79–97)
Monocytes Absolute: 0.5 10*3/uL (ref 0.1–0.9)
Monocytes: 9 %
Neutrophils Absolute: 3.3 10*3/uL (ref 1.4–7.0)
Neutrophils: 61 %
Platelets: 278 10*3/uL (ref 150–450)
RBC: 4.29 x10E6/uL (ref 3.77–5.28)
RDW: 12.8 % (ref 11.7–15.4)
WBC: 5.4 10*3/uL (ref 3.4–10.8)

## 2020-07-09 LAB — LIPID PANEL
Chol/HDL Ratio: 3.1 ratio (ref 0.0–4.4)
Cholesterol, Total: 235 mg/dL — ABNORMAL HIGH (ref 100–199)
HDL: 77 mg/dL (ref >39)
LDL Chol Calc (NIH): 133 mg/dL — ABNORMAL HIGH (ref 0–99)
Triglycerides: 145 mg/dL (ref 0–149)
VLDL Cholesterol Cal: 25 mg/dL (ref 5–40)

## 2020-07-09 LAB — HIV ANTIBODY (ROUTINE TESTING W REFLEX): HIV Screen 4th Generation wRfx: NONREACTIVE

## 2020-07-09 LAB — THYROID PANEL WITH TSH
Free Thyroxine Index: 1.7 (ref 1.2–4.9)
T3 Uptake Ratio: 26 % (ref 24–39)
T4, Total: 6.4 ug/dL (ref 4.5–12.0)
TSH: 4.52 u[IU]/mL — ABNORMAL HIGH (ref 0.450–4.500)

## 2020-07-09 LAB — HEMOGLOBIN A1C
Est. average glucose Bld gHb Est-mCnc: 120 mg/dL
Hgb A1c MFr Bld: 5.8 % — ABNORMAL HIGH (ref 4.8–5.6)

## 2020-07-09 LAB — VITAMIN D 25 HYDROXY (VIT D DEFICIENCY, FRACTURES): Vit D, 25-Hydroxy: 40.1 ng/mL (ref 30.0–100.0)

## 2020-07-09 LAB — HEPATITIS C ANTIBODY: Hep C Virus Ab: <0.1 s/co ratio (ref 0.0–0.9)

## 2020-08-04 ENCOUNTER — Other Ambulatory Visit: Payer: Self-pay | Admitting: Family Medicine

## 2020-08-04 ENCOUNTER — Telehealth: Payer: Self-pay

## 2020-08-04 DIAGNOSIS — Z0184 Encounter for antibody response examination: Secondary | ICD-10-CM

## 2020-08-04 NOTE — Telephone Encounter (Signed)
Copied from [place] [id number]. Topic: General - Inquiry >> Aug 04, 2020  2:28 PM Mathis Bud wrote: Reason for CRM: Patient is requesting to get covid scars anti body test for [facility] test number [phone], she needs PCP to put in order for her. Call back [phone]

## 2020-08-04 NOTE — Telephone Encounter (Signed)
Put in the order through [place].

## 2020-08-11 LAB — SARS-COV-2 SEMI-QUANTITATIVE TOTAL ANTIBODY, SPIKE
SARS-CoV-2 Semi-Quant Total Ab: 572.5 U/mL (ref <0.8)
SARS-CoV-2 Spike Ab Interp: POSITIVE

## 2020-08-17 ENCOUNTER — Ambulatory Visit (INDEPENDENT_AMBULATORY_CARE_PROVIDER_SITE_OTHER): Payer: Managed Care, Other (non HMO) | Admitting: Family Medicine

## 2020-08-17 ENCOUNTER — Encounter: Payer: Self-pay | Admitting: Family Medicine

## 2020-08-17 ENCOUNTER — Other Ambulatory Visit: Payer: Self-pay

## 2020-08-17 DIAGNOSIS — A09 Infectious gastroenteritis and colitis, unspecified: Secondary | ICD-10-CM | POA: Diagnosis not present

## 2020-08-17 MED ORDER — CIPROFLOXACIN HCL 500 MG PO TABS: 500.0000 mg | ORAL_TABLET | Freq: Two times a day (BID) | ORAL | 0 refills | Status: DC

## 2020-08-17 NOTE — Assessment & Plan Note (Signed)
Diarrhea presumed infectious due to history and reported symptoms.  Will treat with ciprofloxacin 500mg  BID x 3 days.  If symptoms persist after completing antibiotics, will send stool culture for evaluation.  To continue BRAT diet and to stop immodium.  Plan: 1. Begin ciprofloxacin 500mg  BID x 3 days 2. Continue oral liquids and foods as tolerated 3. RTC if symptoms worsen or fail to improve with current treatment plan

## 2020-08-17 NOTE — Patient Instructions (Signed)
Begin ciprofloxacin 500mg  twice per day for the next 3 days.  Ciprofloxacin can cause achilles rupture.  STOP CIPRO and call if you develop PAIN IN YOUR HEEL/FEET.  If having continued symptoms after completing antibiotics, will request you come in and we will send a stool sample to the lab for evaluation.  Increase your oral intake of fluids and food  We will plan to see you back if your symptoms worsen or fail to improve  You will receive a survey after today's visit either digitally by e-mail or paper by USPS mail. Your experiences and feedback matter to [place].  Please respond so we know how we are doing as we provide care for you.  Call us with any questions/concerns/needs.  It is my goal to be available to you for your health concerns.  Thanks for choosing me to be a partner in your healthcare needs!  Harlin Rain, FNP-C Family Nurse Practitioner [facility] Phone: [phone]

## 2020-08-17 NOTE — Progress Notes (Signed)
Virtual Visit via Telephone  The purpose of this virtual visit is to provide medical care while limiting exposure to the novel coronavirus (COVID19) for both patient and office staff.  Consent was obtained for phone visit:  Yes.   Answered questions that patient had about telehealth interaction:  Yes.   I discussed the limitations, risks, security and privacy concerns of performing an evaluation and management service by telephone. I also discussed with the patient that there may be a patient responsible charge related to this service. The patient expressed understanding and agreed to proceed.  Patient is at home and is accessed via telephone Services are provided by Harlin Rain, FNP-C from [facility])  ---------------------------------------------------------------------- Chief Complaint  Patient presents with  . Diarrhea    intermittent diarrhea. Pt had frequent diarrhea on last week. The symptoms started Tuesday and lasted 4 days. she took Imodium x 2 doses daily.    S: Reviewed CMA documentation. I have called patient and gathered additional HPI as follows:  Samantha Whitney presents for telemedicine visit for concerns of diarrhea.  Reports symptoms started on 08/11/2020 and lasted for 4 days.  She has taken immodium twice daily which has stopped her bowel movements at this time.  Denies any new restaurants and takeout.  Reports bowel movements were liquid and denies any blood in toilet or on toilet tissue.  Was having some nausea when symptoms were at their peak.  Did rapid COVID home test which was negative.  Has been able to keep down tea and water.  Does work in [place], [place] and is concerned she may have gotten this from the boil water advisory.  Denies fevers, chills, shakes, abdominal pain, SOB, cough.  Is able to tolerate liquids/foods by mouth.  Patient is currently home Denies any high risk travel to areas of current concern for COVID19. Denies any  known or suspected exposure to person with or possibly with COVID19.  Past Medical History:  Diagnosis Date  . Allergy   . GERD (gastroesophageal reflux disease)    Social History   Tobacco Use  . Smoking status: Never Smoker  . Smokeless tobacco: Never Used  Vaping Use  . Vaping Use: Never used  Substance Use Topics  . Alcohol use: Yes    Alcohol/week: 2.0 standard drinks    Types: 2 Glasses of wine per week    Comment: weekly  . Drug use: Never    Current Outpatient Medications:  .  albuterol (PROVENTIL HFA;VENTOLIN HFA) 108 (90 Base) MCG/ACT inhaler, Inhale 1-2 puffs into the lungs every 6 (six) hours as needed for shortness of breath., Disp: 1 Inhaler, Rfl: 2 .  calcium carbonate (OSCAL) 1500 (600 Ca) MG TABS tablet, Take 600 mg of elemental calcium by mouth daily with breakfast. , Disp: , Rfl:  .  conjugated estrogens (PREMARIN) vaginal cream, Use a fingertip amount at opening of vagina nightly for vaginal dryness., Disp: 42.5 g, Rfl: 12 .  esomeprazole (NEXIUM) 40 MG capsule, Take 1 capsule (40 mg total) by mouth daily., Disp: 90 capsule, Rfl: 1 .  naproxen sodium (ALEVE) 220 MG tablet, Take 220 mg by mouth daily as needed., Disp: , Rfl:  .  ciprofloxacin (CIPRO) 500 MG tablet, Take 1 tablet (500 mg total) by mouth 2 (two) times daily., Disp: 6 tablet, Rfl: 0  Depression screen [facility] 2/9 03/16/2020 03/11/2019  Decreased Interest 0 0  Down, Depressed, Hopeless 0 0  PHQ - 2 Score 0 0  No flowsheet data found.  -------------------------------------------------------------------------- O: No physical exam performed due to remote telephone encounter.  Physical Exam: Patient remotely monitored without video.  Verbal communication appropriate.  Cognition normal.  Recent Results (from the past 2160 hour(s))  CBC with Differential     Status: None   Collection Time: 07/08/20  9:16 AM  Result Value Ref Range   WBC 5.4 3.4 - 10.8 x10E3/uL   RBC 4.29 3.77 - 5.28 x10E6/uL    Hemoglobin 12.7 11.1 - 15.9 g/dL   Hematocrit 38.8 34.0 - 46.6 %   MCV 90 79 - 97 fL   MCH 29.6 26.6 - 33.0 pg   MCHC 32.7 31 - 35 g/dL   RDW 12.8 11.7 - 15.4 %   Platelets 278 150 - 450 x10E3/uL   Neutrophils 61 Not Estab. %   Lymphs 26 Not Estab. %   Monocytes 9 Not Estab. %   Eos 3 Not Estab. %   Basos 1 Not Estab. %   Neutrophils Absolute 3.3 1 - 7 x10E3/uL   Lymphocytes Absolute 1.4 0 - 3 x10E3/uL   Monocytes Absolute 0.5 0 - 0 x10E3/uL   EOS (ABSOLUTE) 0.1 0.0 - 0.4 x10E3/uL   Basophils Absolute 0.1 0 - 0 x10E3/uL   Immature Granulocytes 0 Not Estab. %   Immature Grans (Abs) 0.0 0.0 - 0.1 x10E3/uL  CMP14+EGFR     Status: Abnormal   Collection Time: 07/08/20  9:16 AM  Result Value Ref Range   Glucose 100 (H) 65 - 99 mg/dL   BUN 17 8 - 27 mg/dL   Creatinine, Ser 0.79 0.57 - 1.00 mg/dL   GFR calc non Af Amer 79 >59 mL/min/1.73   GFR calc Af Amer 91 >59 mL/min/1.73    Comment: **[facility] currently reports eGFR in compliance with the current**   recommendations of the [employer]. [facility] will   update reporting as new guidelines are published from the NKF-ASN   Task force.    BUN/Creatinine Ratio 22 12 - 28   Sodium 141 134 - 144 mmol/L   Potassium 4.5 3.5 - 5.2 mmol/L   Chloride 103 96 - 106 mmol/L   CO2 24 20 - 29 mmol/L   Calcium 9.6 8.7 - 10.3 mg/dL   Total Protein 7.4 6.0 - 8.5 g/dL   Albumin 4.5 3.8 - 4.8 g/dL   Globulin, Total 2.9 1.5 - 4.5 g/dL   Albumin/Globulin Ratio 1.6 1.2 - 2.2   Bilirubin Total 0.3 0.0 - 1.2 mg/dL   Alkaline Phosphatase 54 48 - 121 IU/L   AST 21 0 - 40 IU/L   ALT 17 0 - 32 IU/L  Lipid Profile     Status: Abnormal   Collection Time: 07/08/20  9:16 AM  Result Value Ref Range   Cholesterol, Total 235 (H) 100 - 199 mg/dL   Triglycerides 145 0 - 149 mg/dL   HDL 77 >39 mg/dL   VLDL Cholesterol Cal 25 5 - 40 mg/dL   LDL Chol Calc (NIH) 133 (H) 0 - 99 mg/dL   Chol/HDL Ratio 3.1 0.0 - 4.4 ratio    Comment:                                    T. Chol/HDL Ratio  Men  Women                               1/2 Avg.Risk  3.4    3.3                                   Avg.Risk  5.0    4.4                                2X Avg.Risk  9.6    7.1                                3X Avg.Risk 23.4   11.0   Thyroid Panel With TSH     Status: Abnormal   Collection Time: 07/08/20  9:16 AM  Result Value Ref Range   TSH 4.520 (H) 0.450 - 4.500 uIU/mL   T4, Total 6.4 4.5 - 12.0 ug/dL   T3 Uptake Ratio 26 24 - 39 %   Free Thyroxine Index 1.7 1.2 - 4.9  VITAMIN D 25 Hydroxy (Vit-D Deficiency, Fractures)     Status: None   Collection Time: 07/08/20  9:16 AM  Result Value Ref Range   Vit D, 25-Hydroxy 40.1 30.0 - 100.0 ng/mL    Comment: Vitamin D deficiency has been defined by the [place] and an Endocrine Society practice guideline as a level of serum 25-OH vitamin D less than 20 ng/mL (1,2). The Endocrine Society went on to further define vitamin D insufficiency as a level between 21 and 29 ng/mL (2). 1. IOM (Institute of Medicine). 2010. Dietary reference    intakes for calcium and D. [place]: The    [employer]. 2. Holick MF, Binkley [place], Bischoff-Ferrari HA, et al.    Evaluation, treatment, and prevention of vitamin D    deficiency: an Endocrine Society clinical practice    guideline. JCEM. 2011 Jul; 96(7):1911-30.   HgB A1c     Status: Abnormal   Collection Time: 07/08/20  9:16 AM  Result Value Ref Range   Hgb A1c MFr Bld 5.8 (H) 4.8 - 5.6 %    Comment:          Prediabetes: 5.7 - 6.4          Diabetes: >6.4          Glycemic control for adults with diabetes: <7.0    Est. average glucose Bld gHb Est-mCnc 120 mg/dL  HIV antibody (with reflex)     Status: None   Collection Time: 07/08/20  9:16 AM  Result Value Ref Range   HIV Screen 4th Generation wRfx Non Reactive Non Reactive  Hepatitis C Antibody     Status: None   Collection Time: 07/08/20   9:16 AM  Result Value Ref Range   Hep C Virus Ab <0.1 0.0 - 0.9 s/co ratio    Comment:                                   Negative:     < 0.8  Indeterminate: 0.8 - 0.9                                   Positive:     > 0.9  The CDC recommends that a positive HCV antibody result  be followed up with a HCV Nucleic Acid Amplification  test (550713).   SARS-CoV-2 Semi-Quantitative Total Antibody, Spike     Status: None   Collection Time: 08/10/20  9:14 AM  Result Value Ref Range   SARS-CoV-2 Semi-Quant Total Ab 572.5 Negative<0.8 U/mL    Comment: Antibodies against the SARS-CoV-2 spike protein receptor binding domain (RBD) were detected. It is yet undetermined what level of antibody to SARS-CoV-2 spike protein correlates to immunity against developing symptomatic SARS-CoV-2 disease. Studies are underway to measure the quantitative levels of specific SARS-CoV-2 antibodies following vaccination. Such studies will provide valuable insights into the correlation between protection from vaccination and antibody levels.    SARS-CoV-2 Spike Ab Interp Positive     Comment: Roche Elecsys Anti-SARS-CoV-2 S    -------------------------------------------------------------------------- A&P:  Problem List Items Addressed This Visit      Digestive   Diarrhea of infectious origin - Primary    Diarrhea presumed infectious due to history and reported symptoms.  Will treat with ciprofloxacin 500mg BID x 3 days.  If symptoms persist after completing antibiotics, will send stool culture for evaluation.  To continue BRAT diet and to stop immodium.  Plan: 1. Begin ciprofloxacin 500mg BID x 3 days 2. Continue oral liquids and foods as tolerated 3. RTC if symptoms worsen or fail to improve with current treatment plan      Relevant Medications   ciprofloxacin (CIPRO) 500 MG tablet      Meds ordered this encounter  Medications  . ciprofloxacin (CIPRO) 500 MG tablet    Sig:  Take 1 tablet (500 mg total) by mouth 2 (two) times daily.    Dispense:  6 tablet    Refill:  0    Follow-up: - Return if symptoms worsen or fail to improve with current treatment  Patient verbalizes understanding with the above medical recommendations including the limitation of remote medical advice.  Specific follow-up and call-back criteria were given for patient to follow-up or seek medical care more urgently if needed.  - Time spent in direct consultation with patient on phone: 6 minutes  Nicole Marie Malfi, FNP-C [facility] 08/17/2020, 9:55 AM  

## 2020-09-01 ENCOUNTER — Encounter: Payer: Self-pay | Admitting: Family Medicine

## 2020-09-01 ENCOUNTER — Ambulatory Visit: Payer: Self-pay | Admitting: Family Medicine

## 2020-09-01 ENCOUNTER — Telehealth (INDEPENDENT_AMBULATORY_CARE_PROVIDER_SITE_OTHER): Payer: Managed Care, Other (non HMO) | Admitting: Family Medicine

## 2020-09-01 ENCOUNTER — Other Ambulatory Visit: Payer: Self-pay

## 2020-09-01 DIAGNOSIS — J40 Bronchitis, not specified as acute or chronic: Secondary | ICD-10-CM

## 2020-09-01 DIAGNOSIS — J189 Pneumonia, unspecified organism: Secondary | ICD-10-CM | POA: Insufficient documentation

## 2020-09-01 MED ORDER — AZITHROMYCIN 250 MG PO TABS: ORAL_TABLET | ORAL | 0 refills | Status: DC

## 2020-09-01 MED ORDER — PREDNISONE 50 MG PO TABS: ORAL_TABLET | ORAL | 0 refills | Status: DC

## 2020-09-01 MED ORDER — ALBUTEROL SULFATE HFA 108 (90 BASE) MCG/ACT IN AERS: 1.0000 | INHALATION_SPRAY | Freq: Four times a day (QID) | RESPIRATORY_TRACT | 1 refills | Status: DC | PRN

## 2020-09-01 NOTE — Progress Notes (Signed)
Virtual Visit via Telephone  The purpose of this virtual visit is to provide medical care while limiting exposure to the novel coronavirus (COVID19) for both patient and office staff.  Consent was obtained for phone visit:  Yes.   Answered questions that patient had about telehealth interaction:  Yes.   I discussed the limitations, risks, security and privacy concerns of performing an evaluation and management service by telephone. I also discussed with the patient that there may be a patient responsible charge related to this service. The patient expressed understanding and agreed to proceed.  Patient is at home and is accessed via telephone Services are provided by Harlin Rain, FNP-C from [facility])  ---------------------------------------------------------------------- Chief Complaint  Patient presents with  . Cough    tightiness in chest, non-prodictive cough. Pt state when she does deep breathing she develop cough x 2 days. Pt concern because she has a history of bronchitis and feel like it starting up. [place] she done a 5 mile hiking on yesterday.     S: Reviewed CMA documentation. I have called patient and gathered additional HPI as follows:  Samantha Whitney presents for virtual telemedicine visit for concerns of chest tightness with non-productive cough x 2 days.  Reports that this typically happens to her twice per year that she goes on vacation, after running herself down with work/stress, on seasonal changes and she ends up with a quick bronchitis that progresses to pneumonia within a day or two.  Requesting to restart on steroids, her inhaler and antibiotics to help clear this up before her symptoms worsen.  Patient is currently on vacation in [place], [place] Denies any high risk travel to areas of current concern for COVID19. Denies any known or suspected exposure to person with or possibly with  COVID19.  Denies any fevers, chills, sweats, body ache, shortness of breath, sinus pain or pressure, headache, abdominal pain, diarrhea  Past Medical History:  Diagnosis Date  . Allergy   . GERD (gastroesophageal reflux disease)    Social History   Tobacco Use  . Smoking status: Never Smoker  . Smokeless tobacco: Never Used  Vaping Use  . Vaping Use: Never used  Substance Use Topics  . Alcohol use: Yes    Alcohol/week: 2.0 standard drinks    Types: 2 Glasses of wine per week    Comment: weekly  . Drug use: Never    Current Outpatient Medications:  .  albuterol (VENTOLIN HFA) 108 (90 Base) MCG/ACT inhaler, Inhale 1-2 puffs into the lungs every 6 (six) hours as needed for shortness of breath., Disp: 18 g, Rfl: 1 .  calcium carbonate (OSCAL) 1500 (600 Ca) MG TABS tablet, Take 600 mg of elemental calcium by mouth daily with breakfast. , Disp: , Rfl:  .  conjugated estrogens (PREMARIN) vaginal cream, Use a fingertip amount at opening of vagina nightly for vaginal dryness., Disp: 42.5 g, Rfl: 12 .  esomeprazole (NEXIUM) 40 MG capsule, Take 1 capsule (40 mg total) by mouth daily., Disp: 90 capsule, Rfl: 1 .  azithromycin (ZITHROMAX) 250 MG tablet, Take 2 tablets on day 1, followed by 1 tablet daily x 4 days, Disp: 6 tablet, Rfl: 0 .  naproxen sodium (ALEVE) 220 MG tablet, Take 220 mg by mouth daily as needed. (Patient not taking: Reported on 09/01/2020), Disp: , Rfl:  .  predniSONE (DELTASONE) 50 MG tablet, Take 1 tablet daily x 5 days, Disp: 5 tablet, Rfl:  0  Depression screen [facility] 2/9 03/16/2020 03/11/2019  Decreased Interest 0 0  Down, Depressed, Hopeless 0 0  PHQ - 2 Score 0 0    No flowsheet data found.  -------------------------------------------------------------------------- O: No physical exam performed due to remote telephone encounter.  Physical Exam: Patient remotely monitored without video.  Verbal communication appropriate.  Cognition normal.  Recent Results (from  the past 2160 hour(s))  CBC with Differential     Status: None   Collection Time: 07/08/20  9:16 AM  Result Value Ref Range   WBC 5.4 3.4 - 10.8 x10E3/uL   RBC 4.29 3.77 - 5.28 x10E6/uL   Hemoglobin 12.7 11.1 - 15.9 g/dL   Hematocrit 38.8 34.0 - 46.6 %   MCV 90 79 - 97 fL   MCH 29.6 26.6 - 33.0 pg   MCHC 32.7 31 - 35 g/dL   RDW 12.8 11.7 - 15.4 %   Platelets 278 150 - 450 x10E3/uL   Neutrophils 61 Not Estab. %   Lymphs 26 Not Estab. %   Monocytes 9 Not Estab. %   Eos 3 Not Estab. %   Basos 1 Not Estab. %   Neutrophils Absolute 3.3 1 - 7 x10E3/uL   Lymphocytes Absolute 1.4 0 - 3 x10E3/uL   Monocytes Absolute 0.5 0 - 0 x10E3/uL   EOS (ABSOLUTE) 0.1 0.0 - 0.4 x10E3/uL   Basophils Absolute 0.1 0 - 0 x10E3/uL   Immature Granulocytes 0 Not Estab. %   Immature Grans (Abs) 0.0 0.0 - 0.1 x10E3/uL  CMP14+EGFR     Status: Abnormal   Collection Time: 07/08/20  9:16 AM  Result Value Ref Range   Glucose 100 (H) 65 - 99 mg/dL   BUN 17 8 - 27 mg/dL   Creatinine, Ser 0.79 0.57 - 1.00 mg/dL   GFR calc non Af Amer 79 >59 mL/min/1.73   GFR calc Af Amer 91 >59 mL/min/1.73    Comment: **[facility] currently reports eGFR in compliance with the current**   recommendations of the [employer]. [facility] will   update reporting as new guidelines are published from the NKF-ASN   Task force.    BUN/Creatinine Ratio 22 12 - 28   Sodium 141 134 - 144 mmol/L   Potassium 4.5 3.5 - 5.2 mmol/L   Chloride 103 96 - 106 mmol/L   CO2 24 20 - 29 mmol/L   Calcium 9.6 8.7 - 10.3 mg/dL   Total Protein 7.4 6.0 - 8.5 g/dL   Albumin 4.5 3.8 - 4.8 g/dL   Globulin, Total 2.9 1.5 - 4.5 g/dL   Albumin/Globulin Ratio 1.6 1.2 - 2.2   Bilirubin Total 0.3 0.0 - 1.2 mg/dL   Alkaline Phosphatase 54 48 - 121 IU/L   AST 21 0 - 40 IU/L   ALT 17 0 - 32 IU/L  Lipid Profile     Status: Abnormal   Collection Time: 07/08/20  9:16 AM  Result Value Ref Range   Cholesterol, Total 235 (H) 100 - 199 mg/dL    Triglycerides 145 0 - 149 mg/dL   HDL 77 >39 mg/dL   VLDL Cholesterol Cal 25 5 - 40 mg/dL   LDL Chol Calc (NIH) 133 (H) 0 - 99 mg/dL   Chol/HDL Ratio 3.1 0.0 - 4.4 ratio    Comment:  T. Chol/HDL Ratio                                             Men  Women                               1/2 Avg.Risk  3.4    3.3                                   Avg.Risk  5.0    4.4                                2X Avg.Risk  9.6    7.1                                3X Avg.Risk 23.4   11.0   Thyroid Panel With TSH     Status: Abnormal   Collection Time: 07/08/20  9:16 AM  Result Value Ref Range   TSH 4.520 (H) 0.450 - 4.500 uIU/mL   T4, Total 6.4 4.5 - 12.0 ug/dL   T3 Uptake Ratio 26 24 - 39 %   Free Thyroxine Index 1.7 1.2 - 4.9  VITAMIN D 25 Hydroxy (Vit-D Deficiency, Fractures)     Status: None   Collection Time: 07/08/20  9:16 AM  Result Value Ref Range   Vit D, 25-Hydroxy 40.1 30.0 - 100.0 ng/mL    Comment: Vitamin D deficiency has been defined by the [place] and an Endocrine Society practice guideline as a level of serum 25-OH vitamin D less than 20 ng/mL (1,2). The Endocrine Society went on to further define vitamin D insufficiency as a level between 21 and 29 ng/mL (2). 1. IOM (Institute of Medicine). 2010. Dietary reference    intakes for calcium and D. [place]: The    [employer]. 2. Holick MF, Binkley [place], Bischoff-Ferrari HA, et al.    Evaluation, treatment, and prevention of vitamin D    deficiency: an Endocrine Society clinical practice    guideline. JCEM. 2011 Jul; 96(7):1911-30.   HgB A1c     Status: Abnormal   Collection Time: 07/08/20  9:16 AM  Result Value Ref Range   Hgb A1c MFr Bld 5.8 (H) 4.8 - 5.6 %    Comment:          Prediabetes: 5.7 - 6.4          Diabetes: >6.4          Glycemic control for adults with diabetes: <7.0    Est. average glucose Bld gHb Est-mCnc 120 mg/dL  HIV antibody (with reflex)      Status: None   Collection Time: 07/08/20  9:16 AM  Result Value Ref Range   HIV Screen 4th Generation wRfx Non Reactive Non Reactive  Hepatitis C Antibody     Status: None   Collection Time: 07/08/20  9:16 AM  Result Value Ref Range   Hep C Virus Ab <0.1 0.0 - 0.9 s/co ratio    Comment:  Negative:     < 0.8                              Indeterminate: 0.8 - 0.9                                   Positive:     > 0.9  The CDC recommends that a positive HCV antibody result  be followed up with a HCV Nucleic Acid Amplification  test (915056).   SARS-CoV-2 Semi-Quantitative Total Antibody, Spike     Status: None   Collection Time: 08/10/20  9:14 AM  Result Value Ref Range   SARS-CoV-2 Semi-Quant Total Ab 572.5 Negative<0.8 U/mL    Comment: Antibodies against the SARS-CoV-2 spike protein receptor binding domain (RBD) were detected. It is yet undetermined what level of antibody to SARS-CoV-2 spike protein correlates to immunity against developing symptomatic SARS-CoV-2 disease. Studies are underway to measure the quantitative levels of specific SARS-CoV-2 antibodies following vaccination. Such studies will provide valuable insights into the correlation between protection from vaccination and antibody levels.    SARS-CoV-2 Spike Ab Interp Positive     Comment: Roche Elecsys Anti-SARS-CoV-2 S    -------------------------------------------------------------------------- A&P:  Problem List Items Addressed This Visit      Respiratory   Pneumonia    Patient great historian, reports bronchitis that rapidly progresses to pneumonia 2x per year, with seasonal changes and typically when she is on vacation.  Reports will run herself down with work and stress and will begin to have symptoms.  Reports cough and chest congestion x 2 days, feeling just like previous episodes of bronchitis that have progressed to pneumonia.  Typically resolves with steroids, an inhaler  and antibiotics.  Is out of town in [place], [place].  Will send in prescriptions.  Plan: 1. Begin prednisone 45m daily x 5 days 2. Begin azithromycin 5024mtoday, followed by 25047maily x 4 days 3. Take albuterol inhaler 1-2 puffs every 4-6 hours as needed for cough, shortness of breath and/or wheezing 4. RTC or local urgent care/ER if symptoms worsen or fail to improve      Relevant Medications   albuterol (VENTOLIN HFA) 108 (90 Base) MCG/ACT inhaler   azithromycin (ZITHROMAX) 250 MG tablet    Other Visit Diagnoses    Bronchitis    -  Primary   Relevant Medications   albuterol (VENTOLIN HFA) 108 (90 Base) MCG/ACT inhaler   predniSONE (DELTASONE) 50 MG tablet   azithromycin (ZITHROMAX) 250 MG tablet      Meds ordered this encounter  Medications  . albuterol (VENTOLIN HFA) 108 (90 Base) MCG/ACT inhaler    Sig: Inhale 1-2 puffs into the lungs every 6 (six) hours as needed for shortness of breath.    Dispense:  18 g    Refill:  1    Product selection permitted for insurance/patient brand preference  . predniSONE (DELTASONE) 50 MG tablet    Sig: Take 1 tablet daily x 5 days    Dispense:  5 tablet    Refill:  0  . azithromycin (ZITHROMAX) 250 MG tablet    Sig: Take 2 tablets on day 1, followed by 1 tablet daily x 4 days    Dispense:  6 tablet    Refill:  0    Follow-up: - Return if symptoms worsen or fail to improve with current treatment plan  Patient verbalizes understanding with the above medical recommendations including the limitation of remote medical advice.  Specific follow-up and call-back criteria were given for patient to follow-up or seek medical care more urgently if needed.  - Time spent in direct consultation with patient on phone: 11 minutes  Harlin Rain, [facility] 09/01/2020, 10:12 AM

## 2020-09-01 NOTE — Assessment & Plan Note (Signed)
Patient great historian, reports bronchitis that rapidly progresses to pneumonia 2x per year, with seasonal changes and typically when she is on vacation.  Reports will run herself down with work and stress and will begin to have symptoms.  Reports cough and chest congestion x 2 days, feeling just like previous episodes of bronchitis that have progressed to pneumonia.  Typically resolves with steroids, an inhaler and antibiotics.  Is out of town in [place], [place].  Will send in prescriptions.  Plan: 1. Begin prednisone 50mg  daily x 5 days 2. Begin azithromycin 500mg  today, followed by 250mg  daily x 4 days 3. Take albuterol inhaler 1-2 puffs every 4-6 hours as needed for cough, shortness of breath and/or wheezing 4. RTC or local urgent care/ER if symptoms worsen or fail to improve

## 2020-09-01 NOTE — Telephone Encounter (Signed)
Pt reports she is out of town, developed "Bronchitis." Agent scheduled virtual appt for tomorrow, pt sent to triage when she stated mild SOB. States "I get this bronchitis often and I am sure this is what's happening.I get a Z-Pack and it helps quickly." States moderate cough, productive for small amt. Clear phlegm. Afebrile, states mild SOB at times "But I did a 5 mile hike yesterday without any problem."  States using inhaler, effective. No CP, tightness. No known covid exposure, fully vaccinated. Pt requesting Z-Pack be called in to pharmacy in [place]. Made aware she would need appt as scheduled for tomorrow before antibiotics ordered.   Pt states "Please ask so this doesn't turn into walking pneumonia.I get this often ." Assured pt NT would route to practice for PCPs review. Advised to keep appt tomorrow.   Care advise given; if symptoms worsen go to UC/ED.  CB# [phone]  Reason for Disposition  [1] Continuous (nonstop) coughing interferes with work or school AND [2] no improvement using cough treatment per protocol  Answer Assessment - Initial Assessment Questions 1. ONSET: "When did the cough begin?"      Yesterday 2. SEVERITY: "How bad is the cough today?"     moderate 3. SPUTUM: "Describe the color of your sputum" (none, dry cough; clear, white, yellow, green)     Clear "Metallic taste" 4. HEMOPTYSIS: "Are you coughing up any blood?" If so ask: "How much?" (flecks, streaks, tablespoons, etc.)     *No Answer* 5. DIFFICULTY BREATHING: "Are you having difficulty breathing?" If Yes, ask: "How bad is it?" (e.g., mild, moderate, severe)    - MILD: No SOB at rest, mild SOB with walking, speaks normally in sentences, can lay down, no retractions, pulse < 100.    - MODERATE: SOB at rest, SOB with minimal exertion and prefers to sit, cannot lie down flat, speaks in phrases, mild retractions, audible wheezing, pulse 100-120.    - SEVERE: Very SOB at rest, speaks in single words,  struggling to breathe, sitting hunched forward, retractions, pulse > 120      Mild "[place]" 6. FEVER: "Do you have a fever?" If Yes, ask: "What is your temperature, how was it measured, and when did it start?"    no 7. CARDIAC HISTORY: "Do you have any history of heart disease?" (e.g., heart attack, congestive heart failure)      *No Answer* 8. LUNG HISTORY: "Do you have any history of lung disease?"  (e.g., pulmonary embolus, asthma, emphysema)     *No Answer* 9. PE RISK FACTORS: "Do you have a history of blood clots?" (or: recent major surgery, recent prolonged travel, bedridden)     *No Answer* 10. OTHER SYMPTOMS: "Do you have any other symptoms?" (e.g., runny nose, wheezing, chest pain)      no  Protocols used: COUGH - ACUTE NON-PRODUCTIVE-A-AH

## 2020-09-01 NOTE — Telephone Encounter (Signed)
Can we tack her on at the end of the day?  Maybe triage and work up when you have free time and let her know I will be calling her sometime after 4pm?

## 2020-09-01 NOTE — Telephone Encounter (Signed)
Added patient on at 4:20pm and spoke with around 9:50am for virtual visit.

## 2020-09-01 NOTE — Patient Instructions (Signed)
As we discussed, treating as a pneumonia based on your history of such and reported symptoms.  I have sent in a prescription for azithromycin to take as directed  I have sent in a prescription for prednisone 50mg , to take 1 tablet daily for the next 5 days  I have sent in a prescription for albuterol inhaler to take 1-2 puffs every 4-6 hours as needed for cough, shortness of breath and/or wheezing.  If you begin to have worsening shortness of breath, chest pain, fever over 104 that is not responsive to ibuprofen and/or acetaminophen, or impending sense of doom to Samantha Whitney!  We will plan to see you back if your symptoms worsen or fail to improve  You will receive a survey after today's visit either digitally by e-mail or paper by USPS mail. Your experiences and feedback matter to [place].  Please respond so we know how we are doing as we provide care for you.  Call us with any questions/concerns/needs.  It is my goal to be available to you for your health concerns.  Thanks for choosing me to be a partner in your healthcare needs!  Harlin Rain, FNP-C Family Nurse Practitioner [facility] Phone: [phone]

## 2020-09-02 ENCOUNTER — Telehealth: Payer: Managed Care, Other (non HMO) | Admitting: Family Medicine

## 2020-09-11 ENCOUNTER — Telehealth: Payer: Self-pay | Admitting: *Deleted

## 2020-09-11 ENCOUNTER — Ambulatory Visit: Payer: Self-pay | Admitting: *Deleted

## 2020-09-11 NOTE — Telephone Encounter (Signed)
Pt called back in to follow up, advised per message from provider and office. Pt wasn't happy due to late response and pt stated that she has family that's coming in to visit.   Pt says that she will go to an UC if needed.

## 2020-09-11 NOTE — Telephone Encounter (Signed)
We can swab for COVID today at 4:15pm if she would like.  Likely would need to have a chest x-ray, we do not have the capacity to do that in our clinic today.  Can have a chest xray and a COVID swab with treatment through Urgent Care in [place] or [place].  I would encourage Urgent Care so someone can listen to her and take a chest xray.

## 2020-09-11 NOTE — Telephone Encounter (Signed)
Per initial encounter, "Pt is calling and would like to see nicole today. Pt was dx with bronchitis about a week ago. Pt was given abx and prednisone. Pt said she is going down hill and does not want to go another over the weekend. Please advise "; contacted pt to discuss symptoms; she had a tele visit on 09/01/20 and given azithromycin and prednisone; on 09/07/20 she started having chest tightenss in the bottom of her chest,and she is having to use her inhaler constantly; she had a negative OTC rapid COVID test 09/09/20; recommendations made per nurse triage protocol; also spoke with [facility] and she confirms the pt should be evaluated at Urgent Care because has no availability until 09/14/20; pt notified and verbalized understanding but she does not want to go to Urgent Care because se has multiple business meetings today; she is hoping to have a phone consultation; the pt can be contacted at [phone]; will route to office for final disposition.  Reason for Disposition . [1] MILD difficulty breathing (e.g., minimal/no SOB at rest, SOB with walking, pulse <100) AND [2] NEW-onset or WORSE than normal  Answer Assessment - Initial Assessment Questions 1. RESPIRATORY STATUS: "Describe your breathing?" (e.g., wheezing, shortness of breath, unable to speak, severe coughing)      Chest tightness in bottom of chest 2. ONSET: "When did this breathing problem begin?"     Worsened 09/07/20 3. PATTERN "Does the difficult breathing come and go, or has it been constant since it started?"      constant 4. SEVERITY: "How bad is your breathing?" (e.g., mild, moderate, severe)    - MILD: No SOB at rest, mild SOB with walking, speaks normally in sentences, can lay down, no retractions, pulse < 100.    - MODERATE: SOB at rest, SOB with minimal exertion and prefers to sit, cannot lie down flat, speaks in phrases, mild retractions, audible wheezing, pulse 100-120.    - SEVERE: Very SOB at rest, speaks in single words,  struggling to breathe, sitting hunched forward, retractions, pulse > 120      moderate 5. RECURRENT SYMPTOM: "Have you had difficulty breathing before?" If Yes, ask: "When was the last time?" and "What happened that time?"      Previous visit 09/03/20 6. CARDIAC HISTORY: "Do you have any history of heart disease?" (e.g., heart attack, angina, bypass surgery, angioplasty)      no 7. LUNG HISTORY: "Do you have any history of lung disease?"  (e.g., pulmonary embolus, asthma, emphysema)     Hx bronchitis 8. CAUSE: "What do you think is causing the breathing problem?"      Ongoing bronchitis; bronchospasm 9. OTHER SYMPTOMS: "Do you have any other symptoms? (e.g., dizziness, runny nose, cough, chest pain, fever)   Barking cough 10. PREGNANCY: "Is there any chance you are pregnant?" "When was your last menstrual period?"       no 11. TRAVEL: "Have you traveled out of the country in the last month?" (e.g., travel history, exposures)    [place] to [place] back 918/21  Protocols used: BREATHING DIFFICULTY-A-AH

## 2020-09-11 NOTE — Telephone Encounter (Signed)
See previous triage note

## 2020-09-11 NOTE — Telephone Encounter (Signed)
Per initial encounter, "Pt is calling and would like to see nicole today. Pt was dx with bronchitis about a week ago. Pt was given abx and prednisone. Pt said she is going down hill and does not want to go another over the weekend. Please advise "; contacted pt to discuss symptoms; on 09/07/20 she started having chest tightenss in the bottom of her chest,and she is having to use her inhaler constantly; see nurse triage note dated 09/11/20

## 2020-09-12 ENCOUNTER — Other Ambulatory Visit: Payer: Self-pay

## 2020-09-12 ENCOUNTER — Ambulatory Visit (INDEPENDENT_AMBULATORY_CARE_PROVIDER_SITE_OTHER): Payer: Managed Care, Other (non HMO)

## 2020-09-12 ENCOUNTER — Other Ambulatory Visit: Payer: Self-pay | Admitting: Family Medicine

## 2020-09-12 ENCOUNTER — Ambulatory Visit
Admission: EM | Admit: 2020-09-12 | Discharge: 2020-09-12 | Disposition: A | Discharge status: Home / Self Care | Payer: Managed Care, Other (non HMO) | Attending: Emergency Medicine | Admitting: Emergency Medicine

## 2020-09-12 DIAGNOSIS — R0602 Shortness of breath: Secondary | ICD-10-CM | POA: Diagnosis not present

## 2020-09-12 DIAGNOSIS — J40 Bronchitis, not specified as acute or chronic: Secondary | ICD-10-CM | POA: Diagnosis not present

## 2020-09-12 DIAGNOSIS — R05 Cough: Secondary | ICD-10-CM

## 2020-09-12 DIAGNOSIS — K219 Gastro-esophageal reflux disease without esophagitis: Secondary | ICD-10-CM

## 2020-09-12 MED ORDER — DOXYCYCLINE HYCLATE 100 MG PO CAPS: 100.0000 mg | ORAL_CAPSULE | Freq: Two times a day (BID) | ORAL | 0 refills | Status: DC

## 2020-09-12 NOTE — Telephone Encounter (Signed)
Requested Prescriptions  Pending Prescriptions Disp Refills  . esomeprazole (NEXIUM) 40 MG capsule [Pharmacy Med Name: ESOMEPRAZOLE MAGNESIUM 40MG  DR CAPS] 90 capsule 1    Sig: TAKE 1 CAPSULE(40 MG) BY MOUTH DAILY     Gastroenterology: Proton Pump Inhibitors Passed - 09/12/2020  5:04 PM      Passed - Valid encounter within last 12 months    Recent Outpatient Visits          1 week ago [place], FNP   3 weeks ago Diarrhea of infectious origin   [place], FNP   6 months ago Need for pneumococcal vaccine   [place], Muse   1 year ago Encounter for annual physical exam   [facility] Mikey College, NP   1 year ago Gastroesophageal reflux disease, esophagitis presence not specified   [facility] Merrilyn Puma, Jerrel Ivory, NP

## 2020-09-12 NOTE — ED Provider Notes (Signed)
[facility]    CSN: [id number] Arrival date & time: 09/12/20  1332      History   Chief Complaint Chief Complaint  Patient presents with  . Cough    HPI Samantha Whitney is a 64 y.o. female.   64 yo female who is here for evaluation of cough with SOB and wheezing upon exertion. She reports that her symptoms started 10 days ago while in [place]- she called ehr PCP and was treated with Azithromycin and Prednisone x 5 days.  She has a multi-year history of bronchitis that requires antibiotics to clear, typically Azithromycin and on 2 occassions Doxycycline.   She denies fever, sinus pain/pressure, ST, ear pressure, or sputum production. She has been using her Albuterol inhaler at home with some relief.     Past Medical History:  Diagnosis Date  . Allergy   . GERD (gastroesophageal reflux disease)     Patient Active Problem List   Diagnosis Date Noted  . Pneumonia 09/01/2020  . Diarrhea of infectious origin 08/17/2020  . GERD (gastroesophageal reflux disease) 03/16/2020  . Routine medical exam 03/16/2020    Past Surgical History:  Procedure Laterality Date  . NO PAST SURGERIES      OB History   No obstetric history on file.      Home Medications    Prior to Admission medications   Medication Sig Start Date End Date Taking? Authorizing Provider  albuterol (VENTOLIN HFA) 108 (90 Base) MCG/ACT inhaler Inhale 1-2 puffs into the lungs every 6 (six) hours as needed for shortness of breath. 09/01/20  Yes Malfi, Lupita Raider, FNP  calcium carbonate (OSCAL) 1500 (600 Ca) MG TABS tablet Take 600 mg of elemental calcium by mouth daily with breakfast.    Yes [provider]  conjugated estrogens (PREMARIN) vaginal cream Use a fingertip amount at opening of vagina nightly for vaginal dryness. 04/01/20  Yes Malfi, Lupita Raider, FNP  esomeprazole (NEXIUM) 40 MG capsule Take 1 capsule (40 mg total) by mouth daily. 03/16/20  Yes Malfi, Lupita Raider, FNP  doxycycline  (VIBRAMYCIN) 100 MG capsule Take 1 capsule (100 mg total) by mouth 2 (two) times daily. 09/12/20   Margarette Canada, NP    Family History Family History  Problem Relation Age of Onset  . Cancer Mother        ColoRectal  . Cancer Father        Stomach Cancer    Social History Social History   Tobacco Use  . Smoking status: Never Smoker  . Smokeless tobacco: Never Used  Vaping Use  . Vaping Use: Never used  Substance Use Topics  . Alcohol use: Yes    Alcohol/week: 2.0 standard drinks    Types: 2 Glasses of wine per week    Comment: weekly  . Drug use: Never     Allergies   Penicillins and Scallops [shellfish allergy]   Review of Systems Review of Systems  Constitutional: Negative for activity change, appetite change and fever.  HENT: Negative for congestion, ear pain, rhinorrhea, sinus pressure, sinus pain and sore throat.   Respiratory: Positive for cough, shortness of breath and wheezing. Negative for chest tightness.   Cardiovascular: Negative for chest pain.  Gastrointestinal: Negative for nausea and vomiting.  Genitourinary: Negative for difficulty urinating and frequency.  Musculoskeletal: Negative for arthralgias and myalgias.  Skin: Negative.   Neurological: Negative for dizziness and syncope.  Hematological: Negative.   Psychiatric/Behavioral: Negative.      Physical Exam Triage Vital Signs  ED Triage Vitals  Enc Vitals Group     BP 09/12/20 1441 102/87     Pulse Rate 09/12/20 1441 78     Resp 09/12/20 1441 18     Temp 09/12/20 1441 98 F (36.7 C)     Temp Source 09/12/20 1441 Oral     SpO2 09/12/20 1441 97 %     Weight 09/12/20 1439 200 lb (90.7 kg)     Height 09/12/20 1439 5\' 4"  (1.626 m)     Head Circumference --      Peak Flow --      Pain Score 09/12/20 1439 0     Pain Loc --      Pain Edu? --      Excl. in [place]? --    No data found.  Updated Vital Signs BP 102/87 (BP Location: Left Arm)   Pulse 78   Temp 98 F (36.7 C) (Oral)   Resp 18    Ht 5\' 4"  (1.626 m)   Wt 200 lb (90.7 kg)   SpO2 97%   BMI 34.33 kg/m   Visual Acuity Right Eye Distance:   Left Eye Distance:   Bilateral Distance:    Right Eye Near:   Left Eye Near:    Bilateral Near:     Physical Exam Vitals and nursing note reviewed.  Constitutional:      Appearance: Normal appearance.  HENT:     Head: Normocephalic and atraumatic.     Right Ear: Tympanic membrane, ear canal and external ear normal.     Left Ear: Tympanic membrane, ear canal and external ear normal.     Nose: Nose normal.     Mouth/Throat:     Mouth: Mucous membranes are moist.     Pharynx: Oropharynx is clear.  Eyes:     Extraocular Movements: Extraocular movements intact.     Conjunctiva/sclera: Conjunctivae normal.     Pupils: Pupils are equal, round, and reactive to light.  Cardiovascular:     Rate and Rhythm: Normal rate.     Pulses: Normal pulses.     Heart sounds: Normal heart sounds.  Pulmonary:     Effort: Pulmonary effort is normal.     Breath sounds: Examination of the right-upper field reveals wheezing and rhonchi. Examination of the left-upper field reveals wheezing and rhonchi. Examination of the right-middle field reveals wheezing and rhonchi. Examination of the left-middle field reveals wheezing and rhonchi. Examination of the right-lower field reveals rales. Examination of the left-lower field reveals rales. Wheezing, rhonchi and rales present.     Comments: Patient has mild rhonchi in middle and upper lobes bilaterally/ She has wheezing in upper and middle when she coughs. There are fine rales in both bases.  Musculoskeletal:        General: Normal range of motion.     Cervical back: Normal range of motion and neck supple.  Lymphadenopathy:     Cervical: No cervical adenopathy.  Skin:    General: Skin is warm and dry.     Capillary Refill: Capillary refill takes less than 2 seconds.  Neurological:     General: No focal deficit present.     Mental Status: She is  alert and oriented to person, place, and time.  Psychiatric:        Mood and Affect: Mood normal.        Behavior: Behavior normal.        Thought Content: Thought content normal.  Judgment: Judgment normal.      UC Treatments / Results  Labs (all labs ordered are listed, but only abnormal results are displayed) Labs Reviewed - No data to display  EKG   Radiology DG Chest 2 View  Result Date: 09/12/2020 CLINICAL DATA:  Cough and shortness of breath EXAM: CHEST - 2 VIEW COMPARISON:  None. FINDINGS: The heart size and mediastinal contours are within normal limits. Both lungs are clear. The visualized skeletal structures are unremarkable. IMPRESSION: No active cardiopulmonary disease. Electronically Signed   By: Inez Catalina M.D.   On: 09/12/2020 15:29    Procedures Procedures (including critical care time)  Medications Ordered in UC Medications - No data to display  Initial Impression / Assessment and Plan / UC Course  I have reviewed the triage vital signs and the nursing notes.  Pertinent labs & imaging results that were available during my care of the patient were reviewed by me and considered in my medical decision making (see chart for details).   Patient is here for a cough that ha lasted 10 days and has not responded to a Z-pack and prednisone. She has been using an Albuterol inhaler that has helped some. She reports [place] and [place] her from hiking.   She reports a long standing history of bronchitis that has always required antibiotics to resolve.   Will obtain CXR and treat with a course of Doxycycline.   CXR independently interpreted by me. There is concern for patchy opacity in the left lung base. Will await radiology over read for full determination.   Over read of CXR shows no pneumonia.  Will D/C home with Doxy for bronchitis.   Final Clinical Impressions(s) / UC Diagnoses   Final diagnoses:    Bronchitis     Discharge Instructions     Take the Doxycycline twice daily with food. Wear sunscreen while taking doxycycline as that medication will make you more susceptible to sunburn. Continue your albuterol inhaler as needed for shortness of breath and wheezing. Use it with a spacer.  Follow-up with your PCP.     ED Prescriptions    Medication Sig Dispense Auth. Provider   doxycycline (VIBRAMYCIN) 100 MG capsule Take 1 capsule (100 mg total) by mouth 2 (two) times daily. 20 capsule Margarette Canada, NP     PDMP not reviewed this encounter.   Margarette Canada, NP 09/12/20 1609

## 2020-09-12 NOTE — ED Triage Notes (Signed)
Patient states that she has a history of bronchitis. States that she took a covid test around the onset. States that she took a course of azithromycin around 10 days ago as well as prednisone. States that she has not improved and is concerned that it may have worsened. States that she would like a chest x-ray. Reports that she took a covid test at the onset of her symptoms and was negative. States "I do not have any covid symptoms". Reports that she checks her O2 on her watch and hasn't been any lower than 92/93.

## 2020-09-12 NOTE — Discharge Instructions (Signed)
Take the Doxycycline twice daily with food. Wear sunscreen while taking doxycycline as that medication will make you more susceptible to sunburn. Continue your albuterol inhaler as needed for shortness of breath and wheezing. Use it with a spacer.  Follow-up with your PCP.

## 2020-09-18 ENCOUNTER — Telehealth: Payer: Self-pay | Admitting: Family Medicine

## 2020-09-18 ENCOUNTER — Ambulatory Visit: Payer: Self-pay

## 2020-09-18 NOTE — Telephone Encounter (Signed)
Called the patient and notified her that she need to finish her 10 day course of antibiotic and if her symptoms seem as if they are worsening she need to be seen again at the Urgent Care. Pt complains of worsening coughing, wheezing with chest discomfort. I informed her per Elmyra Ricks they need to rule out that this has not turn into pneumonia.

## 2020-09-18 NOTE — Telephone Encounter (Signed)
Pt. Seen at UC this past Saturday at [facility] for continued bronchitis. Felt "a little better, but now I'm still coughing, wheezing. Using my Albuterol more frequently." No fever. Had a virtual visit with PCP for bronchitis as well 09/01/20. No availability today. Asking if medication can be sent to pharmacy. Asking to be called back today if possible.  Reason for Disposition . [1] Continuous (nonstop) coughing AND [2] keeps from working or sleeping  Answer Assessment - Initial Assessment Questions 1. RESPIRATORY STATUS: "Describe your breathing?" (e.g., wheezing, shortness of breath, unable to speak, severe coughing)      Cough 2. ONSET: "When did this breathing problem begin?"      Last Saturday 3. PATTERN "Does the difficult breathing come and go, or has it been constant since it started?"      Comes and goes 4. SEVERITY: "How bad is your breathing?" (e.g., mild, moderate, severe)    - MILD: No SOB at rest, mild SOB with walking, speaks normally in sentences, can lay down, no retractions, pulse < 100.    - MODERATE: SOB at rest, SOB with minimal exertion and prefers to sit, cannot lie down flat, speaks in phrases, mild retractions, audible wheezing, pulse 100-120.    - SEVERE: Very SOB at rest, speaks in single words, struggling to breathe, sitting hunched forward, retractions, pulse > 120      Mild 5. RECURRENT SYMPTOM: "Have you had difficulty breathing before?" If Yes, ask: "When was the last time?" and "What happened that time?"      Yes 6. CARDIAC HISTORY: "Do you have any history of heart disease?" (e.g., heart attack, angina, bypass surgery, angioplasty)      No 7. LUNG HISTORY: "Do you have any history of lung disease?"  (e.g., pulmonary embolus, asthma, emphysema)     No 8. CAUSE: "What do you think is causing the breathing problem?"      Bronchitis 9. OTHER SYMPTOMS: "Do you have any other symptoms? (e.g., dizziness, runny nose, cough, chest pain, fever)     Wheezing 10. PREGNANCY:  "Is there any chance you are pregnant?" "When was your last menstrual period?"       No 11. TRAVEL: "Have you traveled out of the country in the last month?" (e.g., travel history, exposures)       No  Protocols used: BREATHING DIFFICULTY-A-AH

## 2020-09-18 NOTE — Telephone Encounter (Signed)
Pt spoke with triage nurse and she is calling back  to let nicole know she took rapid covid 19 test and its negative. Pt would like a return phone call

## 2020-09-19 ENCOUNTER — Ambulatory Visit
Admission: RE | Admit: 2020-09-19 | Discharge: 2020-09-19 | Disposition: A | Discharge status: Home / Self Care | Payer: Managed Care, Other (non HMO) | Source: Ambulatory Visit | Attending: Physician Assistant | Admitting: Physician Assistant

## 2020-09-19 ENCOUNTER — Ambulatory Visit (INDEPENDENT_AMBULATORY_CARE_PROVIDER_SITE_OTHER): Payer: Managed Care, Other (non HMO)

## 2020-09-19 ENCOUNTER — Other Ambulatory Visit: Payer: Self-pay

## 2020-09-19 VITALS — BP 111/72 | HR 85 | Temp 98.2°F | Resp 20 | Ht 64.0 in | Wt 200.0 lb

## 2020-09-19 DIAGNOSIS — R509 Fever, unspecified: Secondary | ICD-10-CM

## 2020-09-19 DIAGNOSIS — J209 Acute bronchitis, unspecified: Secondary | ICD-10-CM | POA: Diagnosis not present

## 2020-09-19 DIAGNOSIS — R0602 Shortness of breath: Secondary | ICD-10-CM

## 2020-09-19 DIAGNOSIS — R059 Cough, unspecified: Secondary | ICD-10-CM | POA: Diagnosis not present

## 2020-09-19 MED ORDER — BENZONATATE 200 MG PO CAPS: 200.0000 mg | ORAL_CAPSULE | Freq: Three times a day (TID) | ORAL | 0 refills | Status: AC | PRN

## 2020-09-19 MED ORDER — PREDNISONE 10 MG PO TABS: ORAL_TABLET | ORAL | 0 refills | Status: DC

## 2020-09-19 NOTE — ED Triage Notes (Signed)
Pt reports 3 weeks of bronchitis. Has been seen by her PCP and also here. Has taken antibiotics and steroids and had CXR. Not improving.

## 2020-09-19 NOTE — Discharge Instructions (Signed)
We have performed another chest x-ray today which was clear.  Suspect viral bronchitis that is likely why you have not improved with antibiotics.  We will try another round of prednisone and Tessalon Perles for cough.  You can take cough syrup at night if you need to.  Increase your fluid intake and consider using cough drops.  Use your albuterol inhaler as needed for any shortness of breath.  If you are not feeling better once you finish the doxycycline another 5 days follow-up with your PCP for further work-up.  As discussed viral bronchitis can last for 4 to 6 weeks sometimes.  If you develop a fever, chest pain, increased breathing difficulty or weakness go to the emergency room.

## 2020-09-19 NOTE — ED Provider Notes (Signed)
[facility]    CSN: [id number] Arrival date & time: 09/19/20  1010      History   Chief Complaint Chief Complaint  Patient presents with  . Cough  . Appointment    HPI Samantha Whitney is a 64 y.o. female presenting for 3-week history of cough and shortness of breath.  She has been seen multiple times for her symptoms.  She states that she saw her PCP at onset of symptoms and was prescribed 5 days of prednisone and 5 days of azithromycin.  She is you felt like she was getting better and then she felt worse and came into [place] urgent [facility] a week ago for worsening cough and shortness of breath.  She states she had a normal chest x-ray at that time and was prescribed doxycycline.  Patient has been taking Robitussin as needed for cough, doxycycline and using albuterol inhaler.  She says that she does get bronchitis yearly usually.  Patient denies any history of asthma or reactive airway.  Patient denies COPD and has never smoked.  Patient says she feels like her cough is getting worse.  She denies any fever, weakness, chest pain, back pain, abdominal pain, nausea, vomiting.  Patient denies known Covid exposure and has had negative Covid testing.  Patient does admit to allergies and takes daily Claritin.  She denies any history of heart problems.  She denies leg swelling or pain.  Patient says she feels like she needs something different because she is not getting better.  Patient says she reached out to her primary care office yesterday and they advised her to go to urgent care to have another chest x-ray to see if she developed pneumonia.  No other concerns today.  HPI  Past Medical History:  Diagnosis Date  . Allergy   . GERD (gastroesophageal reflux disease)     Patient Active Problem List   Diagnosis Date Noted  . Pneumonia 09/01/2020  . Diarrhea of infectious origin 08/17/2020  . GERD (gastroesophageal reflux disease) 03/16/2020  . Routine medical exam 03/16/2020     Past Surgical History:  Procedure Laterality Date  . NO PAST SURGERIES      OB History   No obstetric history on file.      Home Medications    Prior to Admission medications   Medication Sig Start Date End Date Taking? Authorizing Provider  albuterol (VENTOLIN HFA) 108 (90 Base) MCG/ACT inhaler Inhale 1-2 puffs into the lungs every 6 (six) hours as needed for shortness of breath. 09/01/20   Malfi, Lupita Raider, FNP  benzonatate (TESSALON) 200 MG capsule Take 1 capsule (200 mg total) by mouth 3 (three) times daily as needed for up to 10 days for cough. 09/19/20 09/29/20  Laurene Footman B, PA-C  calcium carbonate (OSCAL) 1500 (600 Ca) MG TABS tablet Take 600 mg of elemental calcium by mouth daily with breakfast.     [provider]  conjugated estrogens (PREMARIN) vaginal cream Use a fingertip amount at opening of vagina nightly for vaginal dryness. 04/01/20   Malfi, Lupita Raider, FNP  doxycycline (VIBRAMYCIN) 100 MG capsule Take 1 capsule (100 mg total) by mouth 2 (two) times daily. 09/12/20   Margarette Canada, NP  esomeprazole (NEXIUM) 40 MG capsule TAKE 1 CAPSULE(40 MG) BY MOUTH DAILY 09/12/20   Malfi, Lupita Raider, FNP  predniSONE (DELTASONE) 10 MG tablet Take 6 tablets by mouth today and decrease by 1 tablet for 5 more days 09/19/20   Danton Clap, PA-C  Family History Family History  Problem Relation Age of Onset  . Cancer Mother        ColoRectal  . Cancer Father        Stomach Cancer    Social History Social History   Tobacco Use  . Smoking status: Never Smoker  . Smokeless tobacco: Never Used  Vaping Use  . Vaping Use: Never used  Substance Use Topics  . Alcohol use: Yes    Alcohol/week: 2.0 standard drinks    Types: 2 Glasses of wine per week    Comment: weekly  . Drug use: Never     Allergies   Penicillins and Scallops [shellfish allergy]   Review of Systems Review of Systems  Constitutional: Negative for chills, diaphoresis, fatigue and fever.  HENT:  Positive for congestion. Negative for ear pain, rhinorrhea, sinus pressure, sinus pain and sore throat.   Respiratory: Positive for cough and shortness of breath.   Gastrointestinal: Negative for abdominal pain, nausea and vomiting.  Musculoskeletal: Negative for arthralgias and myalgias.  Skin: Negative for rash.  Neurological: Negative for weakness and headaches.  Hematological: Negative for adenopathy.     Physical Exam Triage Vital Signs ED Triage Vitals  Enc Vitals Group     BP 09/19/20 1048 111/72     Pulse Rate 09/19/20 1048 85     Resp 09/19/20 1048 20     Temp 09/19/20 1048 98.2 F (36.8 C)     Temp Source 09/19/20 1048 Oral     SpO2 09/19/20 1048 100 %     Weight 09/19/20 1047 199 lb 15.3 oz (90.7 kg)     Height 09/19/20 1047 5\' 4"  (1.626 m)     Head Circumference --      Peak Flow --      Pain Score 09/19/20 1047 0     Pain Loc --      Pain Edu? --      Excl. in [place]? --    No data found.  Updated Vital Signs BP 111/72 (BP Location: Right Arm)   Pulse 85   Temp 98.2 F (36.8 C) (Oral)   Resp 20   Ht 5\' 4"  (1.626 m)   Wt 199 lb 15.3 oz (90.7 kg)   SpO2 100%   BMI 34.32 kg/m        Physical Exam Vitals and nursing note reviewed.  Constitutional:      General: She is not in acute distress.    Appearance: Normal appearance. She is obese. She is not ill-appearing or toxic-appearing.  HENT:     Head: Normocephalic and atraumatic.     Nose: Nose normal.     Mouth/Throat:     Mouth: Mucous membranes are moist.     Pharynx: Oropharynx is clear.  Eyes:     General: No scleral icterus.       Right eye: No discharge.        Left eye: No discharge.     Conjunctiva/sclera: Conjunctivae normal.  Cardiovascular:     Rate and Rhythm: Normal rate and regular rhythm.     Heart sounds: Normal heart sounds. No murmur heard.   Pulmonary:     Effort: Pulmonary effort is normal. No respiratory distress.     Breath sounds: Normal breath sounds. No wheezing, rhonchi  or rales.  Musculoskeletal:     Cervical back: Neck supple.  Skin:    General: Skin is dry.  Neurological:     General: No focal deficit present.  Mental Status: She is alert. Mental status is at baseline.     Motor: No weakness.     Gait: Gait normal.  Psychiatric:        Mood and Affect: Mood normal.        Behavior: Behavior normal.        Thought Content: Thought content normal.      UC Treatments / Results  Labs (all labs ordered are listed, but only abnormal results are displayed) Labs Reviewed - No data to display  EKG   Radiology DG Chest 2 View  Result Date: 09/19/2020 CLINICAL DATA:  Cough EXAM: CHEST - 2 VIEW COMPARISON:  September 12, 2020 FINDINGS: The cardiomediastinal silhouette is normal in contour. No pleural effusion. No pneumothorax. No acute pleuroparenchymal abnormality. Visualized abdomen is unremarkable. Mild degenerative changes of the thoracic spine. IMPRESSION: No acute cardiopulmonary abnormality. Electronically Signed   By: Valentino Saxon MD   On: 09/19/2020 11:41    Procedures Procedures (including critical care time)  Medications Ordered in UC Medications - No data to display  Initial Impression / Assessment and Plan / UC Course  I have reviewed the triage vital signs and the nursing notes.  Pertinent labs & imaging results that were available during my care of the patient were reviewed by me and considered in my medical decision making (see chart for details).   64 year old female presenting for continued symptoms of "bronchitis" for the past 3 weeks.  Patient is already been treated with azithromycin and has taken 5 days of doxycycline.  She states the improvement she has gotten the most relief from has been prednisone.  Patient admits to shortness of breath, but oxygen is 100% and lungs are clear.  She states the cough is still productive, but not as much as it was before.  Denies any fevers.  Denies weakness.  Denies chest pain.   Patient concerned because her cough is keeping her up at night and the symptoms seem to be lingering.  Another chest x-ray performed at request of PCP.  Chest x-ray was normal today.  I also independently reviewed the x-ray and it was clear.  Advised patient that her symptoms are likely due to viral bronchitis and it may take another week or 2 for symptoms to completely resolve.  Advised her to complete the course of doxycycline as prescribed.  I sent another taper of prednisone Tessalon Perles for her to try.  Advised her to increase her fluid intake.  Patient advised to make a follow-up with PCP if she is not improving over the next week or 2 or if symptoms worsen.  Advised her she may need to see pulmonologist if symptoms continue.  If she ever develops any chest pain, dizziness, weakness, high fevers, palpitations she should go to emergency department.  Patient agreeable.   Final Clinical Impressions(s) / UC Diagnoses   Final diagnoses:  Acute bronchitis, unspecified organism  Cough  Shortness of breath     Discharge Instructions     We have performed another chest x-ray today which was clear.  Suspect viral bronchitis that is likely why you have not improved with antibiotics.  We will try another round of prednisone and Tessalon Perles for cough.  You can take cough syrup at night if you need to.  Increase your fluid intake and consider using cough drops.  Use your albuterol inhaler as needed for any shortness of breath.  If you are not feeling better once you finish the doxycycline another  5 days follow-up with your PCP for further work-up.  As discussed viral bronchitis can last for 4 to 6 weeks sometimes.  If you develop a fever, chest pain, increased breathing difficulty or weakness go to the emergency room.    ED Prescriptions    Medication Sig Dispense Auth. Provider   predniSONE (DELTASONE) 10 MG tablet Take 6 tablets by mouth today and decrease by 1 tablet for 5 more days 21 tablet  Laurene Footman B, PA-C   benzonatate (TESSALON) 200 MG capsule Take 1 capsule (200 mg total) by mouth 3 (three) times daily as needed for up to 10 days for cough. 30 capsule Danton Clap, PA-C     PDMP not reviewed this encounter.   Laurene Footman B, PA-C 09/20/20 [phone]

## 2020-09-30 ENCOUNTER — Telehealth: Payer: Self-pay

## 2020-09-30 NOTE — Telephone Encounter (Signed)
Copied from [place] [id number]. Topic: Appointment Scheduling - Scheduling Inquiry for Clinic >> Sep 30, 2020 12:36 PM Erick Blinks wrote: Pt failed DT: [id number] Cough, negative covid 19 tests, believes she has bronchitis.   Pt wants to come in to the office for her appt, wants override from [facility]

## 2020-09-30 NOTE — Telephone Encounter (Signed)
We can see her in the office on Friday.  Thanks

## 2020-09-30 NOTE — Telephone Encounter (Signed)
Pt failed the COVID screening done by [facility] for scheduling. She was still demanding to be seen in the office because she had a negative COVID test. I called and spoke with the patient and she informed me that she is still dealing with the respiratory issues that she was seen for x 1 mth ago. I informed the patient that we can see her in the office. I offered her an appt for tomorrow Thursday, Oct 14th and she declined the appt. She stated her schedule is busy and the only time she have available is Friday, Oct 15th at 4:00pm. Appt scheduled for an in office visit on that day. Pt also notified that we do not have radiology in the office on Fridays.

## 2020-10-02 ENCOUNTER — Other Ambulatory Visit: Payer: Self-pay

## 2020-10-02 ENCOUNTER — Encounter: Payer: Self-pay | Admitting: Family Medicine

## 2020-10-02 ENCOUNTER — Ambulatory Visit: Payer: Managed Care, Other (non HMO) | Admitting: Family Medicine

## 2020-10-02 ENCOUNTER — Ambulatory Visit: Payer: Self-pay | Admitting: *Deleted

## 2020-10-02 ENCOUNTER — Ambulatory Visit (INDEPENDENT_AMBULATORY_CARE_PROVIDER_SITE_OTHER): Payer: Managed Care, Other (non HMO) | Admitting: Family Medicine

## 2020-10-02 VITALS — BP 102/69 | HR 93 | Temp 97.3°F | Resp 18 | Ht 64.0 in | Wt 200.8 lb

## 2020-10-02 DIAGNOSIS — J40 Bronchitis, not specified as acute or chronic: Secondary | ICD-10-CM | POA: Insufficient documentation

## 2020-10-02 MED ORDER — SPIRIVA RESPIMAT 1.25 MCG/ACT IN AERS: 2.0000 | INHALATION_SPRAY | Freq: Every day | RESPIRATORY_TRACT | 1 refills | Status: DC

## 2020-10-02 MED ORDER — PROMETHAZINE-DM 6.25-15 MG/5ML PO SYRP: 5.0000 mL | ORAL_SOLUTION | Freq: Four times a day (QID) | ORAL | 0 refills | Status: DC | PRN

## 2020-10-02 MED ORDER — METHYLPREDNISOLONE 4 MG PO TBPK: ORAL_TABLET | ORAL | 0 refills | Status: DC

## 2020-10-02 MED ORDER — ALBUTEROL SULFATE HFA 108 (90 BASE) MCG/ACT IN AERS: 1.0000 | INHALATION_SPRAY | Freq: Four times a day (QID) | RESPIRATORY_TRACT | 1 refills | Status: DC | PRN

## 2020-10-02 NOTE — Patient Instructions (Signed)
I have sent in a prescription for a spiriva respimat inhaler.  Take this daily (2 puffs).  I have sent in a Medrol dose pack to take as directed (steroids)  I have sent in a refill on your albuterol inhaler and we have given you a spacer in clinic today  I have sent in a prescription for Promethazine DM to take 55mL every 6 hours as needed for cough.  Be sure not to drive or operate heavy machinery while using this medication as it can cause sedation.  Do not take the promethazine DM and the robitussin DM, they have the same DM component and will be too much for your system.  Can take over the counter mucinex 1200mg  every 12 hours to help break up the mucus.  If your symptoms, continue, we should look into a referral to Pulmonary for further evaluation and pulmonary function testing  We will plan to see you back if your symptoms worsen or fail to improve  You will receive a survey after today's visit either digitally by e-mail or paper by [employer]. Your experiences and feedback matter to [place].  Please respond so we know how we are doing as we provide care for you.  Call us with any questions/concerns/needs.  It is my goal to be available to you for your health concerns.  Thanks for choosing me to be a partner in your healthcare needs!  Harlin Rain, FNP-C Family Nurse Practitioner [facility] Phone: [phone]

## 2020-10-02 NOTE — Progress Notes (Signed)
Subjective:    Patient ID: Samantha Whitney, female    DOB: 11-15-1956, 64 y.o.   MRN: [id number]  Samantha Whitney is a 64 y.o. female presenting on 10/02/2020 for Cough (persistent cough w/ mild mucus production, chest congestion, wheezing, crackling sounds when taking a deep breath and  nasty taste in her mouth. The coughing seems to worsen as the day persist  x 1 mth )   HPI  Ms. Crume presents to clinic for concerns of persistent cough with mild mucus production, chest congestion, feeling crackling noises when she takes a deep breath and a poor taste in her mouth.  Cough seems to worsen as the day persists.  Has had these symptoms for > 1 month.  Has been tested for COVID and has been negative.  Has had both of her [place] vaccines.  Has been on antibiotics x 2 (both azithromycin and doxycycline) and 2 rounds of steroids.  Has been using her albuterol inhaler every 3-4 hours along with robitussin DM to help with cough at night.  Denies fevers, weight loss, SOB, DOE, CP, lower leg edema, palpitations.  No one at home with similar symptoms.  Depression screen [facility] 2/9 03/16/2020 03/11/2019  Decreased Interest 0 0  Down, Depressed, Hopeless 0 0  PHQ - 2 Score 0 0    Social History   Tobacco Use  . Smoking status: Never Smoker  . Smokeless tobacco: Never Used  Vaping Use  . Vaping Use: Never used  Substance Use Topics  . Alcohol use: Yes    Alcohol/week: 2.0 standard drinks    Types: 2 Glasses of wine per week    Comment: weekly  . Drug use: Never    Review of Systems  Constitutional: Negative.   HENT: Negative.   Eyes: Negative.   Respiratory: Positive for cough and wheezing. Negative for apnea, choking, chest tightness, shortness of breath and stridor.   Cardiovascular: Negative.   Gastrointestinal: Negative.   Endocrine: Negative.   Genitourinary: Negative.   Musculoskeletal: Negative.   Skin: Negative.   Allergic/Immunologic: Negative.   Neurological: Negative.     Hematological: Negative.   Psychiatric/Behavioral: Negative.    Per HPI unless specifically indicated above     Objective:    BP 102/69 (BP Location: Left Arm, Patient Position: Sitting, Cuff Size: Large)   Pulse 93   Temp (!) 97.3 F (36.3 C) (Oral)   Resp 18   Ht 5\' 4"  (1.626 m)   Wt 200 lb 12.8 oz (91.1 kg)   SpO2 98%   BMI 34.47 kg/m   Wt Readings from Last 3 Encounters:  10/02/20 200 lb 12.8 oz (91.1 kg)  09/19/20 199 lb 15.3 oz (90.7 kg)  09/12/20 200 lb (90.7 kg)    Physical Exam Vitals and nursing note reviewed.  Constitutional:      General: She is not in acute distress.    Appearance: Normal appearance. She is well-developed and well-groomed. She is obese. She is not ill-appearing or toxic-appearing.  HENT:     Head: Normocephalic and atraumatic.     Nose:     Comments: Lizbeth Bark is in place, covering mouth and nose. Eyes:     General: Lids are normal. Vision grossly intact.        Right eye: No discharge.        Left eye: No discharge.     Extraocular Movements: Extraocular movements intact.     Conjunctiva/sclera: Conjunctivae normal.     Pupils: Pupils are equal,  round, and reactive to light.  Cardiovascular:     Rate and Rhythm: Normal rate and regular rhythm.     Pulses: Normal pulses.          Dorsalis pedis pulses are 2+ on the right side and 2+ on the left side.     Heart sounds: Normal heart sounds. No murmur heard.  No friction rub. No gallop.   Pulmonary:     Effort: Pulmonary effort is normal. No accessory muscle usage, prolonged expiration or respiratory distress.     Breath sounds: Examination of the left-lower field reveals wheezing. Wheezing present.     Comments: Faint inspiratory wheeze left lower lobe Musculoskeletal:     Right lower leg: No edema.     Left lower leg: No edema.  Skin:    General: Skin is warm and dry.     Capillary Refill: Capillary refill takes less than 2 seconds.  Neurological:     General: No focal deficit  present.     Mental Status: She is alert and oriented to person, place, and time.  Psychiatric:        Attention and Perception: Attention and perception normal.        Mood and Affect: Mood and affect normal.        Speech: Speech normal.        Behavior: Behavior normal. Behavior is cooperative.        Thought Content: Thought content normal.        Cognition and Memory: Cognition and memory normal.        Judgment: Judgment normal.    Results for orders placed or performed in visit on 08/04/20  SARS-CoV-2 Semi-Quantitative Total Antibody, Spike  Result Value Ref Range   SARS-CoV-2 Semi-Quant Total Ab 572.5 Negative<0.8 U/mL   SARS-CoV-2 Spike Ab Interp Positive       Assessment & Plan:   Problem List Items Addressed This Visit      Respiratory   Bronchitis - Primary    Gets bronchitis approximately 1x per year, this exacerbation has been present > 1 month.  Has taken both azithromycin and doxycycline along with 2 different courses of steroids.  Denies any history of tobacco/smoking use in the past, no previous history of asthma or COPD.  Likely chronic bronchitis.  Will start on low dose spiriva respimat 2 puffs daily, with short course of steroids, Promethazine DM for nighttime cough, and will refill albuterol inhaler.  Provided with spacer to ensure is getting the inhaled medications.  If symptoms continue without improvement or improve and then rebound, will refer to pulmonary for PFTs and evaluation.  Plan: 1. Begin medrol dose pack as directed 2. Begin spiriva respimat 2 puffs daily, reviewed image of medication inhaler and how to use 3. Continue albuterol inhaler 1-2 puffs every 4-6 hours as needed for cough and shortness of breath 4. Rx for promethazine DM sent to pharmacy, educated to avoid Robitussin DM while taking promethazine DM to avoid taking in too much of the DM medication daily. 5. RTC PRN      Relevant Medications   Tiotropium Bromide Monohydrate (SPIRIVA  RESPIMAT) 1.25 MCG/ACT AERS   methylPREDNISolone (MEDROL DOSEPAK) 4 MG TBPK tablet   promethazine-dextromethorphan (PROMETHAZINE-DM) 6.25-15 MG/5ML syrup   albuterol (VENTOLIN HFA) 108 (90 Base) MCG/ACT inhaler      Meds ordered this encounter  Medications  . Tiotropium Bromide Monohydrate (SPIRIVA RESPIMAT) 1.25 MCG/ACT AERS    Sig: Inhale 2 puffs into the lungs daily.  Dispense:  4 g    Refill:  1  . methylPREDNISolone (MEDROL DOSEPAK) 4 MG TBPK tablet    Sig: Take 6 tablets day 1, 5 tablets day 2, 4 tablets day 3, 3 tablets day 4, 2 tablets day 5 and 1 tablet day 6    Dispense:  21 tablet    Refill:  0  . promethazine-dextromethorphan (PROMETHAZINE-DM) 6.25-15 MG/5ML syrup    Sig: Take 5 mLs by mouth 4 (four) times daily as needed for cough.    Dispense:  118 mL    Refill:  0  . albuterol (VENTOLIN HFA) 108 (90 Base) MCG/ACT inhaler    Sig: Inhale 1-2 puffs into the lungs every 6 (six) hours as needed for shortness of breath.    Dispense:  18 g    Refill:  1    Product selection permitted for insurance/patient brand preference    Follow up plan: Return if symptoms worsen or fail to improve.   Harlin Rain, [place] Family Nurse Practitioner [facility] 10/02/2020, 11:54 AM

## 2020-10-02 NOTE — Assessment & Plan Note (Signed)
Gets bronchitis approximately 1x per year, this exacerbation has been present > 1 month.  Has taken both azithromycin and doxycycline along with 2 different courses of steroids.  Denies any history of tobacco/smoking use in the past, no previous history of asthma or COPD.  Likely chronic bronchitis.  Will start on low dose spiriva respimat 2 puffs daily, with short course of steroids, Promethazine DM for nighttime cough, and will refill albuterol inhaler.  Provided with spacer to ensure is getting the inhaled medications.  If symptoms continue without improvement or improve and then rebound, will refer to pulmonary for PFTs and evaluation.  Plan: 1. Begin medrol dose pack as directed 2. Begin spiriva respimat 2 puffs daily, reviewed image of medication inhaler and how to use 3. Continue albuterol inhaler 1-2 puffs every 4-6 hours as needed for cough and shortness of breath 4. Rx for promethazine DM sent to pharmacy, educated to avoid Robitussin DM while taking promethazine DM to avoid taking in too much of the DM medication daily. 5. RTC PRN

## 2020-10-02 NOTE — Telephone Encounter (Signed)
Patient called to request prescribed medications ordered today during OV be resent to [facility] due to pharmacy reports they did not have record to fill prescriptions. Albuterol, prednisone, sprivia, promethazine resent to pharmacy. Called pharmacy at report given medication were not showing to be filled. Patient verbalized understanding medications may not be filled until tomorrow.

## 2020-10-05 NOTE — Telephone Encounter (Signed)
Did she get these medications?

## 2020-10-06 NOTE — Telephone Encounter (Signed)
Requested medications received confirmation to pharmacy on 10/02/20 at 6:50 pm.

## 2020-12-24 ENCOUNTER — Other Ambulatory Visit: Payer: Self-pay | Admitting: Family Medicine

## 2020-12-24 DIAGNOSIS — J40 Bronchitis, not specified as acute or chronic: Secondary | ICD-10-CM

## 2021-01-27 ENCOUNTER — Ambulatory Visit: Payer: Managed Care, Other (non HMO) | Admitting: Dermatology

## 2021-03-14 IMAGING — CR DG CHEST 2V
2 series · 2 of 2 positions shown · non-contrast
Comparison: None.

CLINICAL DATA: Cough and shortness of breath

EXAM:
CHEST - 2 VIEW

[chest pa]
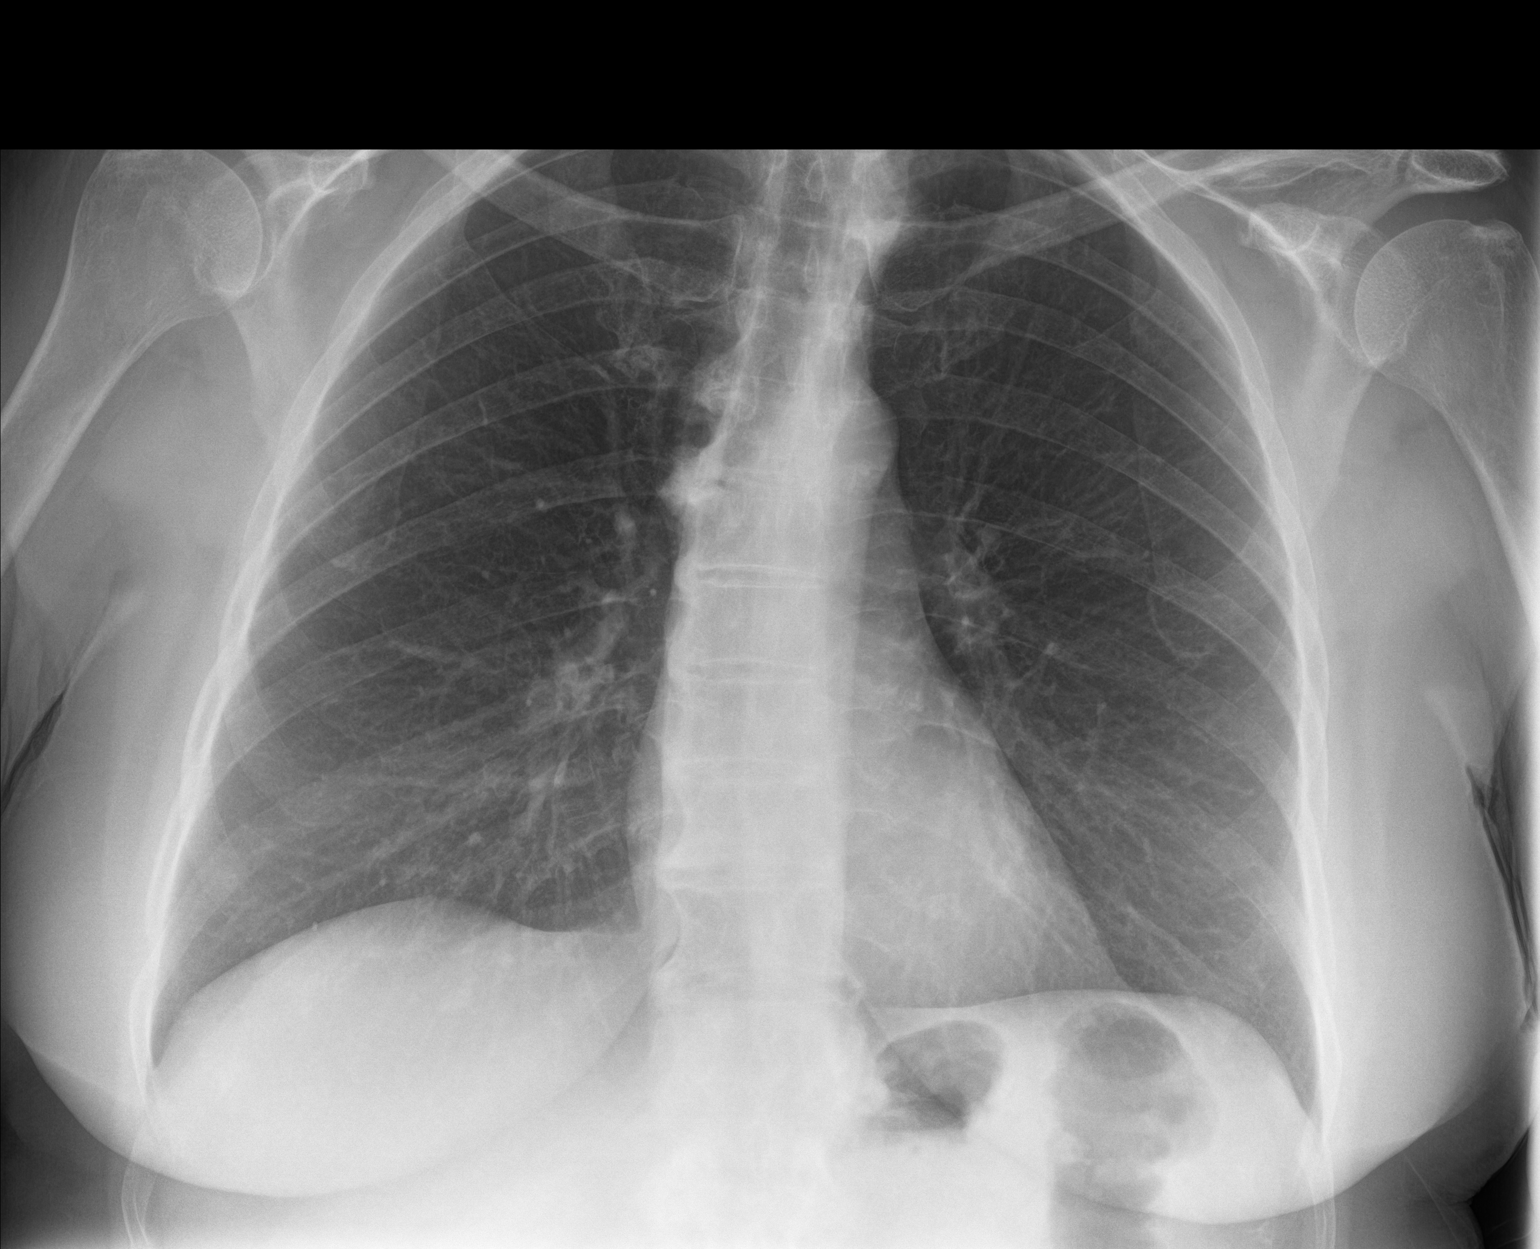

[chest lat]
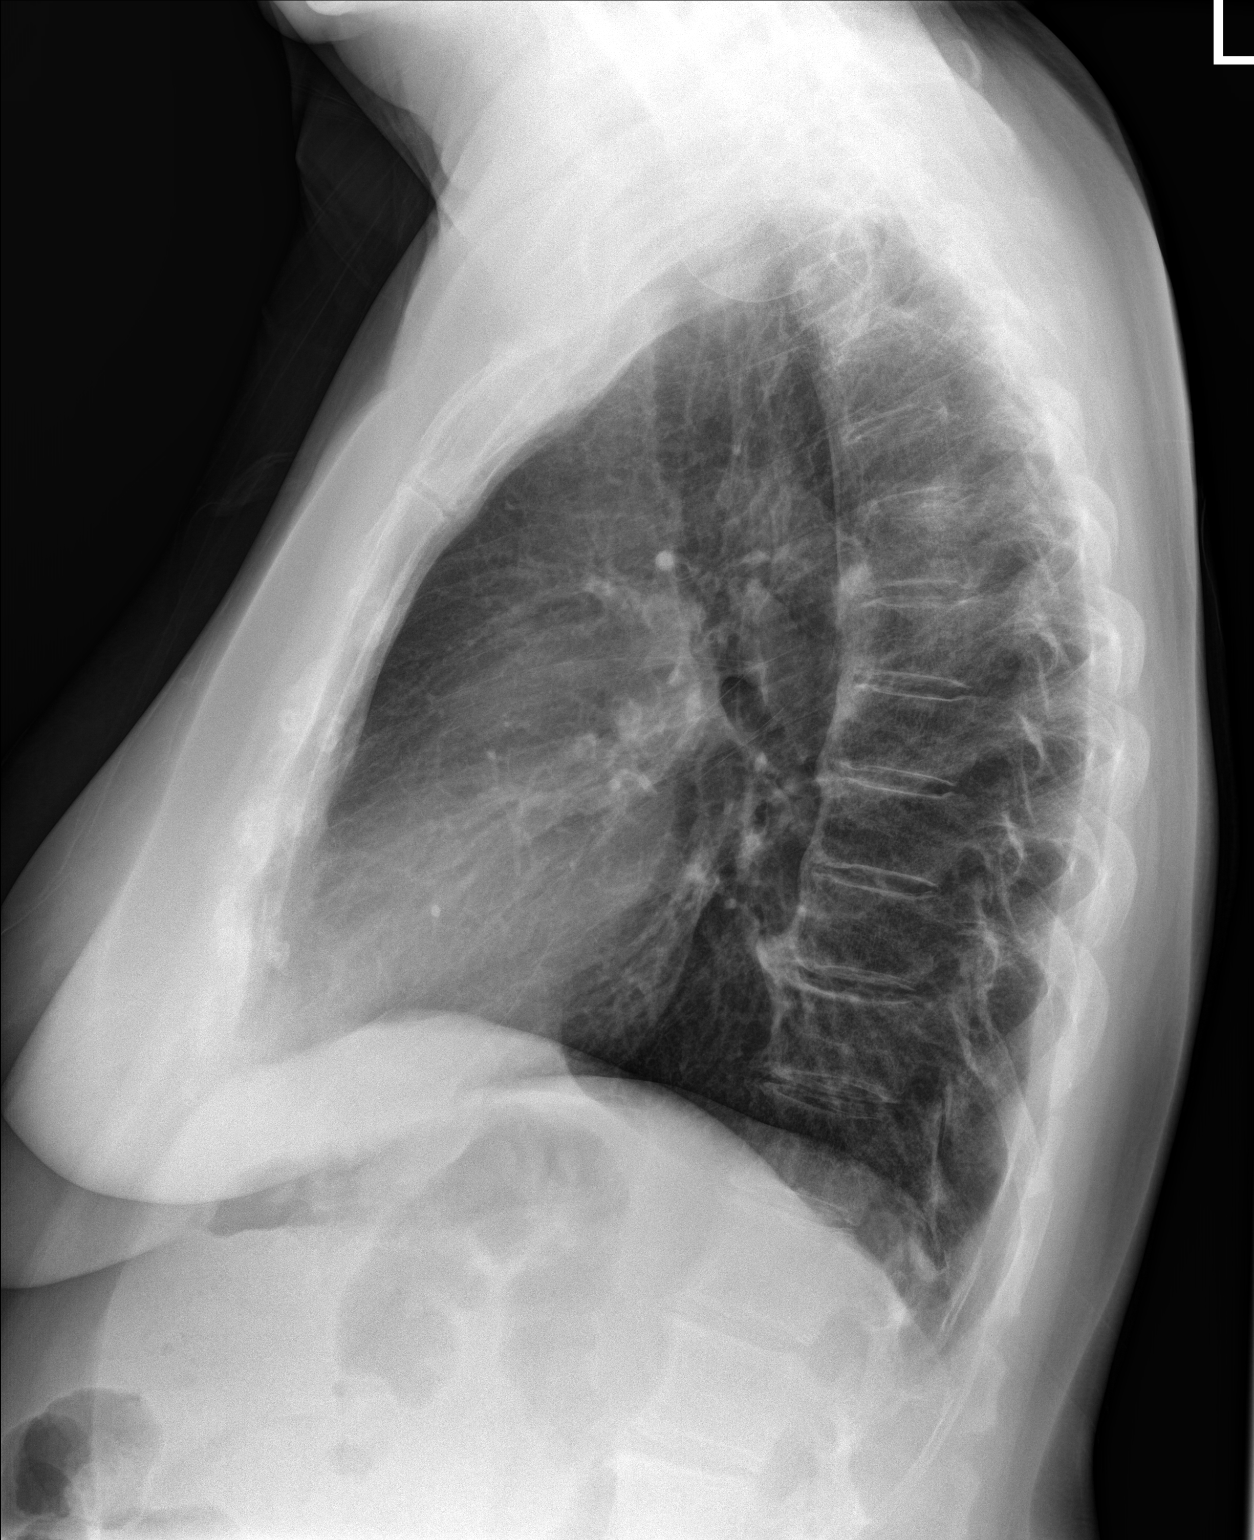

[2 of 2 positions shown; findings below may reference images not displayed]

FINDINGS: The heart size and mediastinal contours are within normal limits.
Both lungs are clear. The visualized skeletal structures are
unremarkable.
IMPRESSION: No active cardiopulmonary disease.

## 2021-04-30 ENCOUNTER — Ambulatory Visit: Payer: Self-pay

## 2021-04-30 NOTE — Telephone Encounter (Signed)
Patient called and says she was exposed on Monday to someone who tested positive for COVID. She says on yesterday afternoon/early evening she started feeling tired and a scratchy throat. She denies fever. She took a home test yesterday and it was negative. She took one today because she started having a headache, still has the scratchy throat, fatigue, the test was positive. She denies SOB, no cough, no other symptoms. She wants to know about taking an antiviral and what she should look out for as far as when to go to the hospital. I advised home care advice, phone number for infusion clinic given to leave a message with her name/phone number/positive home test. Advised this note will be routed to the provider and someone will call on Monday with any recommendations, office closed at the time of the triage. Patient verbalized understanding.    Summary: covid positive   Pt called in stating she just tested positive for COVID today and wanted to know what she can do next and if she can get any medication for COVID.       Reason for Disposition . [1] [id number] diagnosed by positive lab test (e.g., PCR, rapid self-test kit) AND [2] mild symptoms (e.g., cough, fever, others) AND [8] no complications or SOB  Answer Assessment - Initial Assessment Questions 1. COVID-19 DIAGNOSIS: "Who made your COVID-19 diagnosis?" "Was it confirmed by a positive lab test or self-test?" If not diagnosed by a doctor (or NP/PA), ask "Are there lots of cases (community spread) where you live?" Note: See public health department website, if unsure.     Home test today 2. COVID-19 EXPOSURE: "Was there any known exposure to COVID before the symptoms began?" CDC Definition of close contact: within 6 feet (2 meters) for a total of 15 minutes or more over a 24-hour period.      Yes on Monday 3. ONSET: "When did the COVID-19 symptoms start?"      Yesterday afternoon had abdominal cramps, then slight scratchy throat 4. WORST  SYMPTOM: "What is your worst symptom?" (e.g., cough, fever, shortness of breath, muscle aches)     Stress of having it, headache getting worse (started today) 5. COUGH: "Do you have a cough?" If Yes, ask: "How bad is the cough?"       No 6. FEVER: "Do you have a fever?" If Yes, ask: "What is your temperature, how was it measured, and when did it start?"     No 7. RESPIRATORY STATUS: "Describe your breathing?" (e.g., shortness of breath, wheezing, unable to speak)      No 8. BETTER-SAME-WORSE: "Are you getting better, staying the same or getting worse compared to yesterday?"  If getting worse, ask, "In what way?"     Worse, no symptoms yesterday 9. HIGH RISK DISEASE: "Do you have any chronic medical problems?" (e.g., asthma, heart or lung disease, weak immune system, obesity, etc.)     Tendency for bronchitis 10. VACCINE: "Have you had the COVID-19 vaccine?" If Yes, ask: "Which one, how many shots, when did you get it?"       Yes both shots Pfizer and the first booster Moderna 11. BOOSTER: "Have you received your COVID-19 booster?" If Yes, ask: "Which one and when did you get it?"       Moderna  12. PREGNANCY: "Is there any chance you are pregnant?" "When was your last menstrual period?"       No 13. OTHER SYMPTOMS: "Do you have any other symptoms?"  (e.g., chills,  fatigue, headache, loss of smell or taste, muscle pain, sore throat)       Scratchy throat, fatigue, headache 14. O2 SATURATION MONITOR:  "Do you use an oxygen saturation monitor (pulse oximeter) at home?" If Yes, ask "What is your reading (oxygen level) today?" "What is your usual oxygen saturation reading?" (e.g., 95%)       No  Protocols used: CORONAVIRUS (COVID-19) DIAGNOSED OR SUSPECTED-A-AH

## 2021-05-03 NOTE — Telephone Encounter (Signed)
FYI

## 2021-05-03 NOTE — Telephone Encounter (Signed)
Have her schedule a virtual visit to discuss, if she wants oral antiviral. Otherwise, symptom management. Rest, fluids, Tylenol, Zyrtec and Flonase OTC

## 2021-05-04 NOTE — Telephone Encounter (Signed)
The pt seemed to be upset that it took several days before she received a return call. She said she was doing okay and do not feel like she need anything at this time.

## 2021-05-05 ENCOUNTER — Ambulatory Visit: Payer: Managed Care, Other (non HMO) | Admitting: Dermatology

## 2021-06-09 ENCOUNTER — Other Ambulatory Visit: Payer: Self-pay

## 2021-06-09 DIAGNOSIS — K219 Gastro-esophageal reflux disease without esophagitis: Secondary | ICD-10-CM

## 2021-06-09 MED ORDER — ESOMEPRAZOLE MAGNESIUM 40 MG PO CPDR: DELAYED_RELEASE_CAPSULE | ORAL | 0 refills | Status: DC

## 2021-06-15 ENCOUNTER — Encounter: Payer: Self-pay | Admitting: Dermatology

## 2021-06-15 ENCOUNTER — Other Ambulatory Visit: Payer: Self-pay

## 2021-06-15 ENCOUNTER — Ambulatory Visit: Payer: Managed Care, Other (non HMO) | Admitting: Dermatology

## 2021-06-15 DIAGNOSIS — L988 Other specified disorders of the skin and subcutaneous tissue: Secondary | ICD-10-CM

## 2021-06-15 DIAGNOSIS — D1800 Hemangioma unspecified site: Secondary | ICD-10-CM

## 2021-06-15 DIAGNOSIS — L299 Pruritus, unspecified: Secondary | ICD-10-CM

## 2021-06-15 DIAGNOSIS — L720 Epidermal cyst: Secondary | ICD-10-CM | POA: Diagnosis not present

## 2021-06-15 DIAGNOSIS — L820 Inflamed seborrheic keratosis: Secondary | ICD-10-CM

## 2021-06-15 DIAGNOSIS — D229 Melanocytic nevi, unspecified: Secondary | ICD-10-CM

## 2021-06-15 DIAGNOSIS — L219 Seborrheic dermatitis, unspecified: Secondary | ICD-10-CM

## 2021-06-15 DIAGNOSIS — Z1283 Encounter for screening for malignant neoplasm of skin: Secondary | ICD-10-CM

## 2021-06-15 DIAGNOSIS — L578 Other skin changes due to chronic exposure to nonionizing radiation: Secondary | ICD-10-CM

## 2021-06-15 DIAGNOSIS — L814 Other melanin hyperpigmentation: Secondary | ICD-10-CM

## 2021-06-15 DIAGNOSIS — L821 Other seborrheic keratosis: Secondary | ICD-10-CM

## 2021-06-15 MED ORDER — KETOCONAZOLE 2 % EX SHAM: MEDICATED_SHAMPOO | CUTANEOUS | 2 refills | Status: DC

## 2021-06-15 MED ORDER — CLOBETASOL PROPIONATE 0.05 % EX FOAM: CUTANEOUS | 0 refills | Status: DC

## 2021-06-15 NOTE — Progress Notes (Signed)
Follow-Up Visit   Subjective  Samantha Whitney is a 65 y.o. female who presents for the following: Annual Exam (Mole check ). Pt c/o irritated itchy spots on her neck she would like to have removed today.   The patient presents for Total-Body Skin Exam (TBSE) for skin cancer screening and mole check.    The following portions of the chart were reviewed this encounter and updated as appropriate:   Tobacco  Allergies  Meds  Problems  Med Hx  Surg Hx  Fam Hx       Review of Systems:  No other skin or systemic complaints except as noted in HPI or Assessment and Plan.  Objective  Well appearing patient in no apparent distress; mood and affect are within normal limits.  A full examination was performed including scalp, head, eyes, ears, nose, lips, neck, chest, axillae, abdomen, back, buttocks, bilateral upper extremities, bilateral lower extremities, hands, feet, fingers, toes, fingernails, and toenails. All findings within normal limits unless otherwise noted below.  ears, neck No visible rash   posterior neck x 2, left neck x 1 (3) Erythematous keratotic or waxy stuck-on papule or plaque.   left neck Smooth white papule(s).   Scalp, ears Pink patches with greasy scale.   face Rhytides and volume loss.    Assessment & Plan  Pruritus ears, neck  Chronic and persistent   Recommend OTC [place] Rapid Relief Anti-Itch cream (pramoxine + menthol) up to 3 times per day to areas that are itchy.   May consider possible low dose Naltrexone or prescription topical cream in the future if no better   Inflamed seborrheic keratosis posterior neck x 2, left neck x 1  Destruction of lesion - posterior neck x 2, left neck x 1 Complexity: simple   Destruction method: cryotherapy   Informed consent: discussed and consent obtained   Timeout:  patient name, date of birth, surgical site, and procedure verified Lesion destroyed using liquid nitrogen: Yes   Region frozen until  ice ball extended beyond lesion: Yes   Outcome: patient tolerated procedure well with no complications   Post-procedure details: wound care instructions given    Milia left neck  Symptomatic   Acne surgery   Acne/Milia surgery - left neck Procedure risks and benefits were discussed with the patient and verbal consent was obtained. Following prep of the skin on the left neck  with an alcohol swab, extraction of comedones was performed with a comedone extractor. Vaseline ointment was applied to each site. The patient tolerated the procedure well.  Seborrheic dermatitis Scalp, ears  Seborrheic Dermatitis  -  is a chronic persistent rash characterized by pinkness and scaling most commonly of the mid face but also can occur on the scalp (dandruff), ears; mid chest and mid back. It tends to be exacerbated by stress and cooler weather.  People who have neurologic disease may experience new onset or exacerbation of existing seborrheic dermatitis.  The condition is not curable but treatable and can be controlled.   Start ketoconazole 2% shampoo apply three times per week, massage into scalp and leave in for 10 minutes before rinsing out  Start Clobetasol 0.05% foam apply twice daily as needed to affected areas for two weeks then apply twice daily on weekends only,   Avoid applying to face, groin, and axilla. Use as directed. Risk of skin atrophy with long-term use reviewed.    Topical steroids (such as triamcinolone, fluocinolone, fluocinonide, mometasone, clobetasol, halobetasol, betamethasone, hydrocortisone) can cause  thinning and lightening of the skin if they are used for too long in the same area. Your physician has selected the right strength medicine for your problem and area affected on the body. Please use your medication only as directed by your physician to prevent side effects.    Related Medications ketoconazole (NIZORAL) 2 % shampoo apply three times per week, massage into scalp  and leave in for 10 minutes before rinsing out  clobetasol (OLUX) 0.05 % topical foam apply twice daily as needed to affected areas scalp/ears for two weeks then apply twice daily on weekends only, Avoid applying to face, groin, and axilla. Use as directed. Risk of skin atrophy with long-term use reviewed.  Elastosis of skin face  Recommend BBL for dark areas on face with Lind Covert in [place] 1-2 syringes restylane lyft to NL folds Recommend volume 1 syringe to mid-face Recommend 1 syringe defyne to marionette    Lentigines - Scattered tan macules - Due to sun exposure - Benign-appering, observe - Recommend daily broad spectrum sunscreen SPF 30+ to sun-exposed areas, reapply every 2 hours as needed. - Call for any changes  Seborrheic Keratoses Left nasal tip s - Stuck-on, waxy, tan-brown papules and/or plaques  - Benign-appearing - Discussed benign etiology and prognosis. - Observe - Call for any changes  Melanocytic Nevi - Tan-brown and/or pink-flesh-colored symmetric macules and papules - Benign appearing on exam today - Observation - Call clinic for new or changing moles - Recommend daily use of broad spectrum spf 30+ sunscreen to sun-exposed areas.   Hemangiomas - Red papules - Discussed benign nature - Observe - Call for any changes  Actinic Damage - Chronic condition, secondary to cumulative UV/sun exposure - diffuse scaly erythematous macules with underlying dyspigmentation - Recommend daily broad spectrum sunscreen SPF 30+ to sun-exposed areas, reapply every 2 hours as needed.  - Staying in the shade or wearing long sleeves, sun glasses (UVA+UVB protection) and wide brim hats (4-inch brim around the entire circumference of the hat) are also recommended for sun protection.  - Call for new or changing lesions.  Skin cancer screening performed today.   Return in about 1 year (around 06/15/2022) for TBSE .  I, Marye Round, CMA, am acting as  scribe for Forest Gleason, MD .   Documentation: I have reviewed the above documentation for accuracy and completeness, and I agree with the above.  Forest Gleason, MD

## 2021-06-15 NOTE — Patient Instructions (Addendum)
Recommend OTC Gold Bond Rapid Relief Anti-Itch cream (pramoxine + menthol) up to 3 times per day to areas that are itchy.   Lind Covert in [place], [place] for BBL for discoloration on face   Recommend taking Heliocare sun protection supplement daily in sunny weather for additional sun protection. For maximum protection on the sunniest days, you can take up to 2 capsules of regular Heliocare OR take 1 capsule of Heliocare Ultra. For prolonged exposure (such as a full day in the sun), you can repeat your dose of the supplement 4 hours after your first dose. Heliocare can be purchased at [facility] or at [email or web address].     If you have any questions or concerns for your doctor, please call our main line at [phone] and press option 4 to reach your doctor's medical assistant. If no one answers, please leave a voicemail as directed and we will return your call as soon as possible. Messages left after 4 pm will be answered the following business day.   You may also send [place] a message via [place]. We typically respond to MyChart messages within 1-2 business days.  For prescription refills, please ask your pharmacy to contact our office. Our fax number is [phone].  If you have an urgent issue when the clinic is closed that cannot wait until the next business day, you can page your doctor at the number below.    Please note that while we do our best to be available for urgent issues outside of office hours, we are not available 24/7.   If you have an urgent issue and are unable to reach [place], you may choose to seek medical care at your doctor's office, retail clinic, urgent care center, or emergency room.  If you have a medical emergency, please immediately call 911 or go to the emergency department.  Pager Numbers  - Dr. Nehemiah Massed: [phone]  - Dr. Laurence Ferrari: [phone]  - Dr. Nicole Kindred: [phone]  In the event of inclement weather, please call our main line at [phone]  for an update on the status of any delays or closures.  Dermatology Medication Tips: Please keep the boxes that topical medications come in in order to help keep track of the instructions about where and how to use these. Pharmacies typically print the medication instructions only on the boxes and not directly on the medication tubes.   If your medication is too expensive, please contact our office at [phone] option 4 or send [place] a message through [place].   We are unable to tell what your co-pay for medications will be in advance as this is different depending on your insurance coverage. However, we may be able to find a substitute medication at lower cost or fill out paperwork to get insurance to cover a needed medication.   If a prior authorization is required to get your medication covered by your insurance company, please allow [place] 1-2 business days to complete this process.  Drug prices often vary depending on where the prescription is filled and some pharmacies may offer cheaper prices.  The website www.goodrx.com contains coupons for medications through different pharmacies. The prices here do not account for what the cost may be with help from insurance (it may be cheaper with your insurance), but the website can give you the price if you did not use any insurance.  - You can print the associated coupon and take it with your prescription to the pharmacy.  - You may also stop by  our office during regular business hours and pick up a GoodRx coupon card.  - If you need your prescription sent electronically to a different pharmacy, notify our office through [facility] or by phone at [phone] option 4.

## 2021-09-19 ENCOUNTER — Other Ambulatory Visit: Payer: Self-pay | Admitting: Internal Medicine

## 2021-09-19 DIAGNOSIS — K219 Gastro-esophageal reflux disease without esophagitis: Secondary | ICD-10-CM

## 2021-10-08 ENCOUNTER — Ambulatory Visit: Payer: Managed Care, Other (non HMO) | Admitting: Internal Medicine

## 2021-10-08 ENCOUNTER — Other Ambulatory Visit: Payer: Self-pay

## 2021-10-08 ENCOUNTER — Encounter: Payer: Self-pay | Admitting: Internal Medicine

## 2021-10-08 VITALS — BP 105/67 | HR 89 | Temp 97.9°F | Resp 17 | Wt 203.2 lb

## 2021-10-08 DIAGNOSIS — Z1211 Encounter for screening for malignant neoplasm of colon: Secondary | ICD-10-CM

## 2021-10-08 DIAGNOSIS — E6609 Other obesity due to excess calories: Secondary | ICD-10-CM

## 2021-10-08 DIAGNOSIS — Z6834 Body mass index (BMI) 34.0-34.9, adult: Secondary | ICD-10-CM

## 2021-10-08 DIAGNOSIS — R7303 Prediabetes: Secondary | ICD-10-CM | POA: Diagnosis not present

## 2021-10-08 DIAGNOSIS — K219 Gastro-esophageal reflux disease without esophagitis: Secondary | ICD-10-CM | POA: Diagnosis not present

## 2021-10-08 DIAGNOSIS — E7800 Pure hypercholesterolemia, unspecified: Secondary | ICD-10-CM | POA: Insufficient documentation

## 2021-10-08 DIAGNOSIS — R1011 Right upper quadrant pain: Secondary | ICD-10-CM

## 2021-10-08 DIAGNOSIS — Z1231 Encounter for screening mammogram for malignant neoplasm of breast: Secondary | ICD-10-CM

## 2021-10-08 DIAGNOSIS — Z23 Encounter for immunization: Secondary | ICD-10-CM | POA: Diagnosis not present

## 2021-10-08 DIAGNOSIS — E663 Overweight: Secondary | ICD-10-CM | POA: Insufficient documentation

## 2021-10-08 NOTE — Patient Instructions (Signed)
Gallbladder Eating Plan If you have a gallbladder condition, you may have trouble digesting fats. Eating a low-fat diet can help reduce your symptoms, and may be helpful before and after having surgery to remove your gallbladder (cholecystectomy). Your health care provider may recommend that you work with a diet and nutrition specialist (dietitian) to help you reduce the amount of fat in your diet. What are tips for following this plan? General guidelines Limit your fat intake to less than 30% of your total daily calories. If you eat around 1,800 calories each day, this is less than 60 grams (g) of fat per day. Fat is an important part of a healthy diet. Eating a low-fat diet can make it hard to maintain a healthy body weight. Ask your dietitian how much fat, calories, and other nutrients you need each day. Eat small, frequent meals throughout the day instead of three large meals. Drink at least 8-10 cups of fluid a day. Drink enough fluid to keep your urine clear or pale yellow. Limit alcohol intake to no more than 1 drink a day for nonpregnant women and 2 drinks a day for men. One drink equals 12 oz of beer, 5 oz of wine, or 1 oz of hard liquor. Reading food labels  Check Nutrition Facts on food labels for the amount of fat per serving. Choose foods with less than 3 grams of fat per serving. Shopping Choose nonfat and low-fat healthy foods. Look for the words "nonfat," "low fat," or "fat free." Avoid buying processed or prepackaged foods. Cooking Cook using low-fat methods, such as baking, broiling, grilling, or boiling. Cook with small amounts of healthy fats, such as olive oil, grapeseed oil, canola oil, or sunflower oil. What foods are recommended? All fresh, frozen, or canned fruits and vegetables. Whole grains. Low-fat or non-fat (skim) milk and yogurt. Lean meat, skinless poultry, fish, eggs, and beans. Low-fat protein supplement powders or drinks. Spices and herbs. What foods are  not recommended? High-fat foods. These include baked goods, fast food, fatty cuts of meat, ice cream, french toast, sweet rolls, pizza, cheese bread, foods covered with butter, creamy sauces, or cheese. Fried foods. These include french fries, tempura, battered fish, breaded chicken, fried breads, and sweets. Foods with strong odors. Foods that cause bloating and gas. Summary A low-fat diet can be helpful if you have a gallbladder condition, or before and after gallbladder surgery. Limit your fat intake to less than 30% of your total daily calories. This is about 60 g of fat if you eat 1,800 calories each day. Eat small, frequent meals throughout the day instead of three large meals. This information is not intended to replace advice given to you by your health care provider. Make sure you discuss any questions you have with your health care provider. Document Revised: 07/23/2020 Document Reviewed: 07/23/2020 Elsevier Patient Education  2022 [employer].

## 2021-10-08 NOTE — Assessment & Plan Note (Signed)
Encourage diet and exercise for weight loss She is interested in injectable weight loss medication, we will follow-up after labs are back

## 2021-10-08 NOTE — Assessment & Plan Note (Signed)
A1c today Encourage low-carb diet and exercise for weight loss 

## 2021-10-08 NOTE — Progress Notes (Signed)
Subjective:    Patient ID: Samantha Whitney, female    DOB: Apr 25, 1956, 65 y.o.   MRN: [id number]  HPI  Pt presents to the clinic today for follow up of chronic conditions. She is establishing care with me today, transferring care from Cyndia Skeeters, NP.  GERD: She is not sure what triggers this. She denies breakthrough on Omeprazole. There is no upper GI on file.  HLD: Her last LDL was 133, triglycerides 145, 06/2020. She is not taking any cholesterol lowering medication at this time. She tries to consume a low fat diet  Prediabetes: Her last A1C was 5.8%, 06/2020. She is not taking any oral diabetic medication at this time. She does not check her sugars.  She also reports abdominal pain. This started 1 week ago after having a fatty meal. She describes the pain as intense pain in her upper abdomen. She had associated sweating, but denies nausea, vomiting or diarrhea. She reports the pain resolved after an hour without intervention. She reports she knows she has gallstones diagnosed many years ago.   She would like a referral for her mammogram and colonoscopy.   Review of Systems     Past Medical History:  Diagnosis Date   Allergy    GERD (gastroesophageal reflux disease)     Current Outpatient Medications  Medication Sig Dispense Refill   albuterol (VENTOLIN HFA) 108 (90 Base) MCG/ACT inhaler Inhale 1-2 puffs into the lungs every 6 (six) hours as needed for shortness of breath. 18 g 1   calcium carbonate (OSCAL) 1500 (600 Ca) MG TABS tablet Take 600 mg of elemental calcium by mouth daily with breakfast.      clobetasol (OLUX) 0.05 % topical foam apply twice daily as needed to affected areas scalp/ears for two weeks then apply twice daily on weekends only, Avoid applying to face, groin, and axilla. Use as directed. Risk of skin atrophy with long-term use reviewed. 50 g 0   conjugated estrogens (PREMARIN) vaginal cream Use a fingertip amount at opening of vagina nightly for vaginal  dryness. 42.5 g 12   esomeprazole (NEXIUM) 40 MG capsule TAKE 1 CAPSULE(40 MG) BY MOUTH DAILY 90 capsule 0   ketoconazole (NIZORAL) 2 % shampoo apply three times per week, massage into scalp and leave in for 10 minutes before rinsing out 120 mL 2   methylPREDNISolone (MEDROL DOSEPAK) 4 MG TBPK tablet Take 6 tablets day 1, 5 tablets day 2, 4 tablets day 3, 3 tablets day 4, 2 tablets day 5 and 1 tablet day 6 21 tablet 0   promethazine-dextromethorphan (PROMETHAZINE-DM) 6.25-15 MG/5ML syrup Take 5 mLs by mouth 4 (four) times daily as needed for cough. 118 mL 0   SPIRIVA RESPIMAT 1.25 MCG/ACT AERS INHALE 2 PUFFS INTO THE LUNGS DAILY 4 g 2   No current facility-administered medications for this visit.    Allergies  Allergen Reactions   Penicillins    Scallops [Shellfish Allergy]     Family History  Problem Relation Age of Onset   Cancer Mother        ColoRectal   Cancer Father        Stomach Cancer    Social History   Socioeconomic History   Marital status: Married    Spouse name: Not on file   Number of children: Not on file   Years of education: Not on file   Highest education level: Bachelor's degree (e.g., BA, AB, BS)  Occupational History    Employer: [employer]  Tobacco Use  Smoking status: Never   Smokeless tobacco: Never  Vaping Use   Vaping Use: Never used  Substance and Sexual Activity   Alcohol use: Yes    Alcohol/week: 2.0 standard drinks    Types: 2 Glasses of wine per week    Comment: weekly   Drug use: Never   Sexual activity: Not on file  Other Topics Concern   Not on file  Social History Narrative   Not on file   Social Determinants of Health   Financial Resource Strain: Not on file  Food Insecurity: Not on file  Transportation Needs: Not on file  Physical Activity: Not on file  Stress: Not on file  Social Connections: Not on file  Intimate Partner Violence: Not on file     Constitutional: Patient reports difficulty losing weight.  Denies  fever, malaise, fatigue, headache.  HEENT: Denies eye pain, eye redness, ear pain, ringing in the ears, wax buildup, runny nose, nasal congestion, bloody nose, or sore throat. Respiratory: Denies difficulty breathing, shortness of breath, cough or sputum production.   Cardiovascular: Denies chest pain, chest tightness, palpitations or swelling in the hands or feet.  Gastrointestinal: Patient reports abdominal pain.  Denies bloating, constipation, diarrhea or blood in the stool.  GU: Denies urgency, frequency, pain with urination, burning sensation, blood in urine, odor or discharge. Skin: Denies redness, rashes, lesions or ulcercations.  Neurological: Denies dizziness, difficulty with memory, difficulty with speech or problems with balance and coordination.  Psych: Denies anxiety, depression, SI/HI.  No other specific complaints in a complete review of systems (except as listed in HPI above).  Objective:   Physical Exam BP 105/67 (BP Location: Right Arm, Patient Position: Sitting, Cuff Size: Normal)   Pulse 89   Temp 97.9 F (36.6 C) (Temporal)   Resp 17   Wt 203 lb 3.2 oz (92.2 kg)   SpO2 99%   BMI 34.88 kg/m   Wt Readings from Last 3 Encounters:  10/02/20 200 lb 12.8 oz (91.1 kg)  09/19/20 199 lb 15.3 oz (90.7 kg)  09/12/20 200 lb (90.7 kg)    General: Appears her stated age, obese, in NAD. Skin: Warm, dry and intact. HEENT: Head: normal shape and size; Eyes: EOMs intact;  Cardiovascular: Normal rate and rhythm. S1,S2 noted.  No murmur, rubs or gallops noted.  No carotid bruits noted. Pulmonary/Chest: Normal effort and positive vesicular breath sounds. No respiratory distress. No wheezes, rales or ronchi noted.  Abdomen: Soft and nontender.  Hyperactive bowel sounds. No distention or masses noted. Musculoskeletal: No difficulty with gait.  Neurological: Alert and oriented.  Psychiatric: Mood and affect normal. Behavior is normal. Judgment and thought content normal.     BMET    Component Value Date/Time   NA 141 07/08/2020 0916   K 4.5 07/08/2020 0916   CL 103 07/08/2020 0916   CO2 24 07/08/2020 0916   GLUCOSE 100 (H) 07/08/2020 0916   BUN 17 07/08/2020 0916   CREATININE 0.79 07/08/2020 0916   CALCIUM 9.6 07/08/2020 0916   GFRNONAA 79 07/08/2020 0916   GFRAA 91 07/08/2020 0916    Lipid Panel     Component Value Date/Time   CHOL 235 (H) 07/08/2020 0916   TRIG 145 07/08/2020 0916   HDL 77 07/08/2020 0916   CHOLHDL 3.1 07/08/2020 0916   LDLCALC 133 (H) 07/08/2020 0916    CBC    Component Value Date/Time   WBC 5.4 07/08/2020 0916   RBC 4.29 07/08/2020 0916   HGB 12.7 07/08/2020  0916   HCT 38.8 07/08/2020 0916   PLT 278 07/08/2020 0916   MCV 90 07/08/2020 0916   MCH 29.6 07/08/2020 0916   MCHC 32.7 07/08/2020 0916   RDW 12.8 07/08/2020 0916   LYMPHSABS 1.4 07/08/2020 0916   EOSABS 0.1 07/08/2020 0916   BASOSABS 0.1 07/08/2020 0916    Hgb A1C Lab Results  Component Value Date   HGBA1C 5.8 (H) 07/08/2020           Assessment & Plan:   RUQ Abdominal Pain:  C-Met today Will obtain RUQ abdominal ultrasound  Screen for Colon Cancer:  Referral to GI placed  Screen for Breast Cancer:  Mammogram ordered, she will call normal breast center to schedule  RTC in 6 months for your annual exam  Webb Silversmith, NP This visit occurred during the SARS-CoV-2 public health emergency.  Safety protocols were in place, including screening questions prior to the visit, additional usage of staff PPE, and extensive cleaning of exam room while observing appropriate contact time as indicated for disinfecting solutions.

## 2021-10-08 NOTE — Assessment & Plan Note (Signed)
Encourage weight loss as this can help reduce reflux symptoms Continue Omeprazole

## 2021-10-08 NOTE — Assessment & Plan Note (Signed)
C-Met and lipid profile today Encouraged her to consume a low-fat diet 

## 2021-10-19 ENCOUNTER — Ambulatory Visit: Payer: Managed Care, Other (non HMO)

## 2021-10-27 ENCOUNTER — Ambulatory Visit
Admission: RE | Admit: 2021-10-27 | Discharge: 2021-10-27 | Disposition: A | Discharge status: Home / Self Care | Payer: Managed Care, Other (non HMO) | Source: Ambulatory Visit | Attending: Internal Medicine | Admitting: Internal Medicine

## 2021-10-27 ENCOUNTER — Other Ambulatory Visit: Payer: Self-pay

## 2021-10-27 DIAGNOSIS — R1011 Right upper quadrant pain: Secondary | ICD-10-CM | POA: Insufficient documentation

## 2021-10-28 LAB — HEMOGLOBIN A1C
Est. average glucose Bld gHb Est-mCnc: 117 mg/dL
Hgb A1c MFr Bld: 5.7 % — ABNORMAL HIGH (ref 4.8–5.6)

## 2021-10-28 LAB — COMPREHENSIVE METABOLIC PANEL
ALT: 15 IU/L (ref 0–32)
AST: 19 IU/L (ref 0–40)
Albumin/Globulin Ratio: 1.8 (ref 1.2–2.2)
Albumin: 4.6 g/dL (ref 3.8–4.8)
Alkaline Phosphatase: 61 IU/L (ref 44–121)
BUN/Creatinine Ratio: 16 (ref 12–28)
BUN: 14 mg/dL (ref 8–27)
Bilirubin Total: 0.3 mg/dL (ref 0.0–1.2)
CO2: 24 mmol/L (ref 20–29)
Calcium: 9.6 mg/dL (ref 8.7–10.3)
Chloride: 104 mmol/L (ref 96–106)
Creatinine, Ser: 0.86 mg/dL (ref 0.57–1.00)
Globulin, Total: 2.5 g/dL (ref 1.5–4.5)
Glucose: 100 mg/dL — ABNORMAL HIGH (ref 70–99)
Potassium: 4.9 mmol/L (ref 3.5–5.2)
Sodium: 142 mmol/L (ref 134–144)
Total Protein: 7.1 g/dL (ref 6.0–8.5)
eGFR: 74 mL/min/{1.73_m2} (ref >59)

## 2021-10-28 LAB — LIPID PANEL
Chol/HDL Ratio: 2.9 ratio (ref 0.0–4.4)
Cholesterol, Total: 224 mg/dL — ABNORMAL HIGH (ref 100–199)
HDL: 77 mg/dL (ref >39)
LDL Chol Calc (NIH): 123 mg/dL — ABNORMAL HIGH (ref 0–99)
Triglycerides: 138 mg/dL (ref 0–149)
VLDL Cholesterol Cal: 24 mg/dL (ref 5–40)

## 2021-11-03 ENCOUNTER — Telehealth: Payer: Self-pay

## 2021-11-03 NOTE — Telephone Encounter (Signed)
Copied from [place] [phone]. Topic: General - Other >> Nov 03, 2021 10:26 AM Celene Kras wrote: Reason for CRM: Pt called stating that she has received a call from [facility] in regards to her lab results. She states that she would like to have a call back to discuss them. Pt is requesting to receive call between 11-11:30 or 3:30-4. Please advise.    The pt was notified of her lab results and her radiology report. She verbalize understanding, no questions or concerns.

## 2021-11-04 ENCOUNTER — Ambulatory Visit: Payer: Self-pay

## 2021-11-04 NOTE — Telephone Encounter (Signed)
Pt called in stating she is having echoing in her ears and slight dizziness. The echoing started on Mon or Tues and she feels like it occurs in the middle of her head. As far as the dizziness she says she still gets up and moves around and drives just sometimes feels like it takes her body a sec to catch up with her. Denies ear pain, drainage, or popping sensation. Attempted to schedule appt but first available was 11/08/21, pt states she wants to be seen before the weekend, office is closed for lunch so advised I will let office know if she can be worked in tomorrow someone will give her a call and if she doesn't hear from anyone by the end of the day today to go to [facility] tomorrow. Advised pt if she had popping sensation of drainage she needed to get to an ER. Care advice given and pt verbalized understanding. No other questions/concerns noted.    Reason for Disposition  MODERATE-SEVERE tinnitus (i.e., interferes with work, school, or sleep)  Answer Assessment - Initial Assessment Questions 1. DESCRIPTION: "Describe the sound you are hearing." (e.g., hissing, humming, pounding, ringing)     Echoing in the ears  2. LOCATION: "One or both ears?" If one, ask: "Which ear?"     Both, feels like its in the middle of head 3. SEVERITY: "How bad is it?"    - MILD - doesn't interfere with normal activities, only can hear in a quiet room    - MODERATE-SEVERE ([place]other[place]): interferes with work, school, sleep, or other activities      Mild  4. ONSET: "When did this begin?" "Did it start suddenly or come on gradually?"     Mon or Tues 5. PATTERN: "Does this come and go, or has it been constant since it started?"     constsnt 6. HEARING LOSS: "Is your hearing decreased?" (e.g., normal, decreased)       No 7. OTHER SYMPTOMS: "Do you have any other symptoms?" (e.g., dizziness, earache)     Headache that is mild  8. PREGNANCY: "Is there any chance you are pregnant?" "When was your last menstrual period?"      No  Protocols used: Tinnitus-A-AH

## 2021-12-21 ENCOUNTER — Other Ambulatory Visit: Payer: Self-pay | Admitting: Internal Medicine

## 2021-12-21 ENCOUNTER — Telehealth: Payer: Managed Care, Other (non HMO) | Admitting: Physician Assistant

## 2021-12-21 ENCOUNTER — Ambulatory Visit: Payer: Self-pay

## 2021-12-21 DIAGNOSIS — J208 Acute bronchitis due to other specified organisms: Secondary | ICD-10-CM

## 2021-12-21 DIAGNOSIS — B9689 Other specified bacterial agents as the cause of diseases classified elsewhere: Secondary | ICD-10-CM

## 2021-12-21 DIAGNOSIS — K219 Gastro-esophageal reflux disease without esophagitis: Secondary | ICD-10-CM

## 2021-12-21 MED ORDER — PSEUDOEPH-BROMPHEN-DM 30-2-10 MG/5ML PO SYRP: 5.0000 mL | ORAL_SOLUTION | Freq: Four times a day (QID) | ORAL | 0 refills | Status: DC | PRN

## 2021-12-21 MED ORDER — BENZONATATE 100 MG PO CAPS: 100.0000 mg | ORAL_CAPSULE | Freq: Three times a day (TID) | ORAL | 0 refills | Status: DC | PRN

## 2021-12-21 MED ORDER — AZITHROMYCIN 250 MG PO TABS: ORAL_TABLET | ORAL | 0 refills | Status: AC

## 2021-12-21 MED ORDER — PREDNISONE 20 MG PO TABS: 40.0000 mg | ORAL_TABLET | Freq: Every day | ORAL | 0 refills | Status: DC

## 2021-12-21 NOTE — Telephone Encounter (Signed)
Chief Complaint: Cough Symptoms: Sinus congestion, dry cough, fatigue, wheezing, runny nose Frequency: 4-5 days ago Pertinent Negatives: Patient denies fever, denies SOB Disposition: [] ED /[] Urgent Care (no appt availability in office) / [] Appointment(In office/virtual)/ [x]  [facility] [facility]/ [] Home Care/ [] Refused Recommended Disposition /[] [facility]/ []  Follow-up with PCP Additional Notes: No availability in the office today    Summary: poss bronchitis   Pt called saying she has been sick most of the holiday.  She has a cough, chest cong, chills.  Fatigue, no covid///she as for an appt today but no appts until Thursday  Please advise   [phone]      Reason for Disposition  [1] Continuous (nonstop) coughing interferes with work or school AND [2] no improvement using cough treatment per Care Advice  Answer Assessment - Initial Assessment Questions 1. ONSET: "When did the cough begin?"      4-5 days ago 2. SEVERITY: "How bad is the cough today?"      Bad 3. SPUTUM: "Describe the color of your sputum" (none, dry cough; clear, white, yellow, green)     Nothing is coming up 4. HEMOPTYSIS: "Are you coughing up any blood?" If so ask: "How much?" (flecks, streaks, tablespoons, etc.)     N/A 5. DIFFICULTY BREATHING: "Are you having difficulty breathing?" If Yes, ask: "How bad is it?" (e.g., mild, moderate, severe)    - MILD: No SOB at rest, mild SOB with walking, speaks normally in sentences, can lie down, no retractions, pulse < 100.    - MODERATE: SOB at rest, SOB with minimal exertion and prefers to sit, cannot lie down flat, speaks in phrases, mild retractions, audible wheezing, pulse 100-120.    - SEVERE: Very SOB at rest, speaks in single words, struggling to breathe, sitting hunched forward, retractions, pulse > 120      No 6. FEVER: "Do you have a fever?" If Yes, ask: "What is your temperature, how was it measured, and when did it start?"     No 7. CARDIAC  HISTORY: "Do you have any history of heart disease?" (e.g., heart attack, congestive heart failure)      N/A 8. LUNG HISTORY: "Do you have any history of lung disease?"  (e.g., pulmonary embolus, asthma, emphysema)     Bronchitis 9. PE RISK FACTORS: "Do you have a history of blood clots?" (or: recent major surgery, recent prolonged travel, bedridden)     N/A 10. OTHER SYMPTOMS: "Do you have any other symptoms?" (e.g., runny nose, wheezing, chest pain)       Sinus congestion, fatigue, runny nose, wheezing, chest congestion 11. PREGNANCY: "Is there any chance you are pregnant?" "When was your last menstrual period?"       N/A 12. TRAVEL: "Have you traveled out of the country in the last month?" (e.g., travel history, exposures)       N/A  Protocols used: Cough - Acute Non-Productive-A-AH

## 2021-12-21 NOTE — Patient Instructions (Signed)
Sabino Niemann, thank you for joining Mar Daring, PA-C for today's virtual visit.  While this provider is not your primary care provider (PCP), if your PCP is located in our provider database this encounter information will be shared with them immediately following your visit.  Consent: (Patient) Samantha Whitney provided verbal consent for this virtual visit at the beginning of the encounter.  Current Medications:  Current Outpatient Medications:    azithromycin (ZITHROMAX) 250 MG tablet, Take 2 tablets on day 1, then 1 tablet daily on days 2 through 5, Disp: 6 tablet, Rfl: 0   benzonatate (TESSALON) 100 MG capsule, Take 1 capsule (100 mg total) by mouth 3 (three) times daily as needed., Disp: 30 capsule, Rfl: 0   brompheniramine-pseudoephedrine-DM 30-2-10 MG/5ML syrup, Take 5 mLs by mouth 4 (four) times daily as needed., Disp: 120 mL, Rfl: 0   predniSONE (DELTASONE) 20 MG tablet, Take 2 tablets (40 mg total) by mouth daily with breakfast., Disp: 10 tablet, Rfl: 0   albuterol (VENTOLIN HFA) 108 (90 Base) MCG/ACT inhaler, Inhale 1-2 puffs into the lungs every 6 (six) hours as needed for shortness of breath., Disp: 18 g, Rfl: 1   calcium carbonate (OSCAL) 1500 (600 Ca) MG TABS tablet, Take 600 mg of elemental calcium by mouth daily with breakfast. , Disp: , Rfl:    clobetasol (OLUX) 0.05 % topical foam, apply twice daily as needed to affected areas scalp/ears for two weeks then apply twice daily on weekends only, Avoid applying to face, groin, and axilla. Use as directed. Risk of skin atrophy with long-term use reviewed., Disp: 50 g, Rfl: 0   conjugated estrogens (PREMARIN) vaginal cream, Use a fingertip amount at opening of vagina nightly for vaginal dryness., Disp: 42.5 g, Rfl: 12   esomeprazole (NEXIUM) 40 MG capsule, TAKE 1 CAPSULE(40 MG) BY MOUTH DAILY, Disp: 90 capsule, Rfl: 0   ketoconazole (NIZORAL) 2 % shampoo, apply three times per week, massage into scalp and leave in for 10  minutes before rinsing out, Disp: 120 mL, Rfl: 2   SPIRIVA RESPIMAT 1.25 MCG/ACT AERS, INHALE 2 PUFFS INTO THE LUNGS DAILY (Patient taking differently: Inhale 2 puffs into the lungs daily as needed.), Disp: 4 g, Rfl: 2   Medications ordered in this encounter:  Meds ordered this encounter  Medications   azithromycin (ZITHROMAX) 250 MG tablet    Sig: Take 2 tablets on day 1, then 1 tablet daily on days 2 through 5    Dispense:  6 tablet    Refill:  0    Order Specific Question:   Supervising Provider    Answer:   Sabra Heck, BRIAN [[id number]]   predniSONE (DELTASONE) 20 MG tablet    Sig: Take 2 tablets (40 mg total) by mouth daily with breakfast.    Dispense:  10 tablet    Refill:  0    Order Specific Question:   Supervising Provider    Answer:   MILLER, BRIAN [[id number]]   brompheniramine-pseudoephedrine-DM 30-2-10 MG/5ML syrup    Sig: Take 5 mLs by mouth 4 (four) times daily as needed.    Dispense:  120 mL    Refill:  0    Order Specific Question:   Supervising Provider    Answer:   MILLER, BRIAN [[id number]]   benzonatate (TESSALON) 100 MG capsule    Sig: Take 1 capsule (100 mg total) by mouth 3 (three) times daily as needed.    Dispense:  30 capsule    Refill:  0  Order Specific Question:   Supervising Provider    Answer:   Noemi Chapel [[id number]]     *If you need refills on other medications prior to your next appointment, please contact your pharmacy*  Follow-Up: Call back or seek an in-person evaluation if the symptoms worsen or if the condition fails to improve as anticipated.  Other Instructions Acute Bronchitis, Adult Acute bronchitis is sudden inflammation of the main airways (bronchi) that come off the windpipe (trachea) in the lungs. The swelling causes the airways to get smaller and make more mucus than normal. This can make it hard to breathe and can cause coughing or noisy breathing (wheezing). Acute bronchitis may last several weeks. The cough may last longer. Allergies, asthma, and  exposure to smoke may make the condition worse. What are the causes? This condition can be caused by germs and by substances that irritate the lungs, including: Cold and flu viruses. The most common cause of this condition is the virus that causes the common cold. Bacteria. This is less common. Breathing in substances that irritate the lungs, including: Smoke from cigarettes and other forms of tobacco. Dust and pollen. Fumes from household cleaning products, gases, or burned fuel. Indoor or outdoor air pollution. What increases the risk? The following factors may make you more likely to develop this condition: A weak body's defense system, also called the immune system. A condition that affects your lungs and breathing, such as asthma. What are the signs or symptoms? Common symptoms of this condition include: Coughing. This may bring up clear, yellow, or green mucus from your lungs (sputum). Wheezing. Runny or stuffy nose. Having too much mucus in your lungs (chest congestion). Shortness of breath. Aches and pains, including sore throat or chest. How is this diagnosed? This condition is usually diagnosed based on: Your symptoms and medical history. A physical exam. You may also have other tests, including tests to rule out other conditions, such as pneumonia. These tests include: A test of lung function. Test of a mucus sample to look for the presence of bacteria. Tests to check the oxygen level in your blood. Blood tests. Chest X-ray. How is this treated? Most cases of acute bronchitis clear up over time without treatment. Your health care provider may recommend: Drinking more fluids to help thin your mucus so it is easier to cough up. Taking inhaled medicine (inhaler) to improve air flow in and out of your lungs. Using a vaporizer or a humidifier. These are machines that add water to the air to help you breathe better. Taking a medicine that thins mucus and clears congestion  (expectorant). Taking a medicine that prevents or stops coughing (cough suppressant). It is notcommon to take an antibiotic medicine for this condition. Follow these instructions at home:  Take over-the-counter and prescription medicines only as told by your health care provider. Use an inhaler, vaporizer, or humidifier as told by your health care provider. Take two teaspoons (10 mL) of honey at bedtime to lessen coughing at night. Drink enough fluid to keep your urine pale yellow. Do not use any products that contain nicotine or tobacco. These products include cigarettes, chewing tobacco, and vaping devices, such as e-cigarettes. If you need help quitting, ask your health care provider. Get plenty of rest. Return to your normal activities as told by your health care provider. Ask your health care provider what activities are safe for you. Keep all follow-up visits. This is important. How is this prevented? To lower your risk of getting  this condition again: Wash your hands often with soap and water for at least 20 seconds. If soap and water are not available, use hand sanitizer. Avoid contact with people who have cold symptoms. Try not to touch your mouth, nose, or eyes with your hands. Avoid breathing in smoke or chemical fumes. Breathing smoke or chemical fumes will make your condition worse. Get the flu shot every year. Contact a health care provider if: Your symptoms do not improve after 2 weeks. You have trouble coughing up the mucus. Your cough keeps you awake at night. You have a fever. Get help right away if you: Cough up blood. Feel pain in your chest. Have severe shortness of breath. Faint or keep feeling like you are going to faint. Have a severe headache. Have a fever or chills that get worse. These symptoms may represent a serious problem that is an emergency. Do not wait to see if the symptoms will go away. Get medical help right away. Call your local emergency services  (911 in the [place] Do not drive yourself to the hospital. Summary Acute bronchitis is inflammation of the main airways (bronchi) that come off the windpipe (trachea) in the lungs. The swelling causes the airways to get smaller and make more mucus than normal. Drinking more fluids can help thin your mucus so it is easier to cough up. Take over-the-counter and prescription medicines only as told by your health care provider. Do not use any products that contain nicotine or tobacco. These products include cigarettes, chewing tobacco, and vaping devices, such as e-cigarettes. If you need help quitting, ask your health care provider. Contact a health care provider if your symptoms do not improve after 2 weeks. This information is not intended to replace advice given to you by your health care provider. Make sure you discuss any questions you have with your health care provider. Document Revised: 04/07/2021 Document Reviewed: 04/07/2021 Elsevier Patient Education  2022 [employer].    If you have been instructed to have an in-person evaluation today at a local Urgent Care facility, please use the link below. It will take you to a list of all of our available [facility], including address, phone number and hours of operation. Please do not delay care.  [facility]  If you or a family member do not have a primary care provider, use the link below to schedule a visit and establish care. When you choose a [facility] primary care physician or advanced practice provider, you gain a long-term partner in health. Find a Primary Care Provider  Learn more about 's in-office and virtual care options: [facility]

## 2021-12-21 NOTE — Progress Notes (Signed)
Virtual Visit Consent   Samantha Whitney, you are scheduled for a virtual visit with a [facility] provider today.     Just as with appointments in the office, your consent must be obtained to participate.  Your consent will be active for this visit and any virtual visit you may have with one of our providers in the next 365 days.     If you have a MyChart account, a copy of this consent can be sent to you electronically.  All virtual visits are billed to your insurance company just like a traditional visit in the office.    As this is a virtual visit, video technology does not allow for your provider to perform a traditional examination.  This may limit your provider's ability to fully assess your condition.  If your provider identifies any concerns that need to be evaluated in person or the need to arrange testing (such as labs, EKG, etc.), we will make arrangements to do so.     Although advances in technology are sophisticated, we cannot ensure that it will always work on either your end or our end.  If the connection with a video visit is poor, the visit may have to be switched to a telephone visit.  With either a video or telephone visit, we are not always able to ensure that we have a secure connection.     I need to obtain your verbal consent now.   Are you willing to proceed with your visit today?    Samantha Whitney has provided verbal consent on 12/21/2021 for a virtual visit (video or telephone).   Mar Daring, PA-C   Date: 12/21/2021 3:19 PM   Virtual Visit via Video Note   I, Mar Daring, connected with  Samantha Whitney  ([id number], 1956-03-15) on 12/21/21 at  3:00 PM EST by a video-enabled telemedicine application and verified that I am speaking with the correct person using two identifiers.  Location: Patient: Virtual Visit Location Patient: Home Provider: Virtual Visit Location Provider: Home Office   I discussed the limitations of evaluation and management  by telemedicine and the availability of in person appointments. The patient expressed understanding and agreed to proceed.    History of Present Illness: Samantha Whitney is a 66 y.o. who identifies as a female who was assigned female at birth, and is being seen today for cough.  HPI: Cough This is a new problem. The current episode started 1 to 4 weeks ago (12/11/21; worsened on New Years Eve). The problem has been gradually worsening. The problem occurs every few minutes. The cough is Productive of sputum. Associated symptoms include chest pain, nasal congestion, postnasal drip and rhinorrhea. Pertinent negatives include no chills, fever, sore throat or shortness of breath. She has tried OTC cough suppressant for the symptoms. The treatment provided no relief. Her past medical history is significant for bronchitis.   Covid testing is negative  Problems:  Patient Active Problem List   Diagnosis Date Noted   Prediabetes 10/08/2021   Pure hypercholesterolemia 10/08/2021   Class 1 obesity due to excess calories with serious comorbidity and body mass index (BMI) of 34.0 to 34.9 in adult 10/08/2021   GERD (gastroesophageal reflux disease) 03/16/2020    Allergies:  Allergies  Allergen Reactions   Penicillins    Scallops [Shellfish Allergy]    Medications:  Current Outpatient Medications:    azithromycin (ZITHROMAX) 250 MG tablet, Take 2 tablets on day 1, then 1 tablet daily on days 2  through 5, Disp: 6 tablet, Rfl: 0   benzonatate (TESSALON) 100 MG capsule, Take 1 capsule (100 mg total) by mouth 3 (three) times daily as needed., Disp: 30 capsule, Rfl: 0   brompheniramine-pseudoephedrine-DM 30-2-10 MG/5ML syrup, Take 5 mLs by mouth 4 (four) times daily as needed., Disp: 120 mL, Rfl: 0   predniSONE (DELTASONE) 20 MG tablet, Take 2 tablets (40 mg total) by mouth daily with breakfast., Disp: 10 tablet, Rfl: 0   albuterol (VENTOLIN HFA) 108 (90 Base) MCG/ACT inhaler, Inhale 1-2 puffs into the lungs  every 6 (six) hours as needed for shortness of breath., Disp: 18 g, Rfl: 1   calcium carbonate (OSCAL) 1500 (600 Ca) MG TABS tablet, Take 600 mg of elemental calcium by mouth daily with breakfast. , Disp: , Rfl:    clobetasol (OLUX) 0.05 % topical foam, apply twice daily as needed to affected areas scalp/ears for two weeks then apply twice daily on weekends only, Avoid applying to face, groin, and axilla. Use as directed. Risk of skin atrophy with long-term use reviewed., Disp: 50 g, Rfl: 0   conjugated estrogens (PREMARIN) vaginal cream, Use a fingertip amount at opening of vagina nightly for vaginal dryness., Disp: 42.5 g, Rfl: 12   esomeprazole (NEXIUM) 40 MG capsule, TAKE 1 CAPSULE(40 MG) BY MOUTH DAILY, Disp: 90 capsule, Rfl: 0   ketoconazole (NIZORAL) 2 % shampoo, apply three times per week, massage into scalp and leave in for 10 minutes before rinsing out, Disp: 120 mL, Rfl: 2   SPIRIVA RESPIMAT 1.25 MCG/ACT AERS, INHALE 2 PUFFS INTO THE LUNGS DAILY (Patient taking differently: Inhale 2 puffs into the lungs daily as needed.), Disp: 4 g, Rfl: 2  Observations/Objective: Patient is well-developed, well-nourished in no acute distress.  Resting comfortably at home.  Head is normocephalic, atraumatic.  No labored breathing.  Speech is clear and coherent with logical content.  Patient is alert and oriented at baseline.  Dry, barking cough heard frequently  Assessment and Plan: 1. Acute bacterial bronchitis - azithromycin (ZITHROMAX) 250 MG tablet; Take 2 tablets on day 1, then 1 tablet daily on days 2 through 5  Dispense: 6 tablet; Refill: 0 - predniSONE (DELTASONE) 20 MG tablet; Take 2 tablets (40 mg total) by mouth daily with breakfast.  Dispense: 10 tablet; Refill: 0 - brompheniramine-pseudoephedrine-DM 30-2-10 MG/5ML syrup; Take 5 mLs by mouth 4 (four) times daily as needed.  Dispense: 120 mL; Refill: 0 - benzonatate (TESSALON) 100 MG capsule; Take 1 capsule (100 mg total) by mouth 3  (three) times daily as needed.  Dispense: 30 capsule; Refill: 0   -  Worsening symptoms  - Add Zpack, prednisone, Bromfed DM, Tessalon perles - Continue albuterol as needed - Humidifier and steam treatments can help - Push fluids - Rest - Seek in person evaluation if not improving or if symptoms worsen  Follow Up Instructions: I discussed the assessment and treatment plan with the patient. The patient was provided an opportunity to ask questions and all were answered. The patient agreed with the plan and demonstrated an understanding of the instructions.  A copy of instructions were sent to the patient via MyChart unless otherwise noted below.   Patient has requested to receive PHI (AVS, Work Notes, etc) pertaining to this video visit through e-mail as they are currently without active [facility]. They have voiced understand that email is not considered secure and their health information could be viewed by someone other than the patient.   The patient was advised to  call back or seek an in-person evaluation if the symptoms worsen or if the condition fails to improve as anticipated.  Time:  I spent 15 minutes with the patient via telehealth technology discussing the above problems/concerns.    Mar Daring, PA-C

## 2021-12-22 NOTE — Telephone Encounter (Signed)
Requested Prescriptions  Pending Prescriptions Disp Refills   esomeprazole (NEXIUM) 40 MG capsule [Pharmacy Med Name: ESOMEPRAZOLE MAGNESIUM 40MG  DR CAPS] 90 capsule 1    Sig: TAKE 1 CAPSULE(40 MG) BY MOUTH DAILY     Gastroenterology: Proton Pump Inhibitors Passed - 12/21/2021  3:14 AM      Passed - Valid encounter within last 12 months    Recent Outpatient Visits          2 months ago RUQ pain   [facility] [place], Coralie Keens, NP   1 year ago [facility] [place], Lupita Raider, FNP   1 year ago [place], FNP   1 year ago Diarrhea of infectious origin   [facility], Lupita Raider, [place]   1 year ago Need for pneumococcal vaccine   [place], FNP      Future Appointments            In 5 months [facility], [place], [place]

## 2021-12-30 ENCOUNTER — Ambulatory Visit: Payer: Self-pay | Admitting: *Deleted

## 2021-12-30 NOTE — Telephone Encounter (Signed)
°  Chief Complaint: Cough, sinus pain Symptoms: Cough, scant phlegm yellowish,Sinus pressure and pain, mild SOB at night, chest congestion Frequency: Evisit 12/21/21 Bronchitis Pertinent Negatives: Patient denies CP, fever, covid negative but will retest prior to appt Disposition: [] ED /[] Urgent Care (no appt availability in office) / [x] Appointment(In office/virtual)/ []  [facility]/ [] Home Care/ [] Refused Recommended Disposition /[] [facility]/ []  Follow-up with PCP Additional Notes: Completed Z pack and steroids prescribed 12/21/21, felt better after 3 days, reoccurring with sins pressure new.      Reason for Disposition  [1] Continuous (nonstop) coughing interferes with work or school AND [2] no improvement using cough treatment per Care Advice  Answer Assessment - Initial Assessment Questions 1. ONSET: "When did the cough begin?"      12/21/21 2. SEVERITY: "How bad is the cough today?"      Severe 3. SPUTUM: "Describe the color of your sputum" (none, dry cough; clear, white, yellow, green)     Minimal, yellowish at times 4. HEMOPTYSIS: "Are you coughing up any blood?" If so ask: "How much?" (flecks, streaks, tablespoons, etc.)     *No Answer* 5. DIFFICULTY BREATHING: "Are you having difficulty breathing?" If Yes, ask: "How bad is it?" (e.g., mild, moderate, severe)    - MILD: No SOB at rest, mild SOB with walking, speaks normally in sentences, can lie down, no retractions, pulse < 100.    - MODERATE: SOB at rest, SOB with minimal exertion and prefers to sit, cannot lie down flat, speaks in phrases, mild retractions, audible wheezing, pulse 100-120.    - SEVERE: Very SOB at rest, speaks in single words, struggling to breathe, sitting hunched forward, retractions, pulse > 120      Mild. 6. FEVER: "Do you have a fever?" If Yes, ask: "What is your temperature, how was it measured, and when did it start?"     No but "Sweating at times." 7. CARDIAC HISTORY: "Do you have  any history of heart disease?" (e.g., heart attack, congestive heart failure)       8. LUNG HISTORY: "Do you have any history of lung disease?"  (e.g., pulmonary embolus, asthma, emphysema)      9. PE RISK FACTORS: "Do you have a history of blood clots?" (or: recent major surgery, recent prolonged travel, bedridden)      10. OTHER SYMPTOMS: "Do you have any other symptoms?" (e.g., runny nose, wheezing, chest pain)       Sinus pain and pressure, SOB with exertion, "Whistling sound sometimes when lying down."  Protocols used: Cough - Acute Productive-A-AH

## 2021-12-31 ENCOUNTER — Other Ambulatory Visit: Payer: Self-pay

## 2021-12-31 ENCOUNTER — Encounter: Payer: Self-pay | Admitting: Internal Medicine

## 2021-12-31 ENCOUNTER — Ambulatory Visit: Payer: Managed Care, Other (non HMO) | Admitting: Internal Medicine

## 2021-12-31 VITALS — BP 112/68 | HR 81 | Ht 64.0 in | Wt 223.0 lb

## 2021-12-31 DIAGNOSIS — K8020 Calculus of gallbladder without cholecystitis without obstruction: Secondary | ICD-10-CM

## 2021-12-31 DIAGNOSIS — J40 Bronchitis, not specified as acute or chronic: Secondary | ICD-10-CM

## 2021-12-31 MED ORDER — SPIRIVA RESPIMAT 1.25 MCG/ACT IN AERS: 2.0000 | INHALATION_SPRAY | Freq: Every day | RESPIRATORY_TRACT | 2 refills | Status: DC

## 2021-12-31 MED ORDER — PREDNISONE 10 MG PO TABS: ORAL_TABLET | ORAL | 0 refills | Status: DC

## 2021-12-31 NOTE — Progress Notes (Signed)
HPI  Patient presents the clinic today with complaint of sinus pain and pressure, runny nose, chills and sweats.  She reports this started 2 days ago.  She is blowing clear mucus out of her nose.  She currently denies headache, nasal congestion, ear pain, sore throat, cough, shortness of breath, nausea, vomiting or diarrhea.  She denies fever, or body aches.  She did an E-visit 1/3 for similar symptoms and was prescribed Prednisone and Azithromycin which she took with some relief of symptoms.  She does not feel like her symptoms fully resolved.  She has had multiple negative home COVID test.  She is also requesting a referral to general surgery for evaluation of gallstones.  She reports these have been there for some time.  She reports intermittent abdominal pain, nausea, vomiting and diarrhea.  She is not currently having the symptoms.  Right upper quadrant ultrasound from 10/2021 reviewed.  Review of Systems     Past Medical History:  Diagnosis Date   Allergy    GERD (gastroesophageal reflux disease)     Family History  Problem Relation Age of Onset   Cancer Mother        ColoRectal   Cancer Father        Stomach Cancer    Social History   Socioeconomic History   Marital status: Married    Spouse name: Not on file   Number of children: Not on file   Years of education: Not on file   Highest education level: Bachelor's degree (e.g., BA, AB, BS)  Occupational History    Employer: [employer]  Tobacco Use   Smoking status: Never   Smokeless tobacco: Never  Vaping Use   Vaping Use: Never used  Substance and Sexual Activity   Alcohol use: Yes    Alcohol/week: 2.0 standard drinks    Types: 2 Glasses of wine per week    Comment: weekly   Drug use: Never   Sexual activity: Not on file  Other Topics Concern   Not on file  Social History Narrative   Not on file   Social Determinants of Health   Financial Resource Strain: Not on file  Food Insecurity: Not on file   Transportation Needs: Not on file  Physical Activity: Not on file  Stress: Not on file  Social Connections: Not on file  Intimate Partner Violence: Not on file    Allergies  Allergen Reactions   Penicillins    Scallops [Shellfish Allergy]      Constitutional: Pt reports chills and sweats. Denies headache, fatigue, fever or abrupt weight changes.  HEENT:  Positive runny nose. Denies eye redness, ear pain, ringing in the ears, wax buildup, nasal congestion or bloody nose. Respiratory:  Denies cough, difficulty breathing or shortness of breath.  Cardiovascular: Denies chest pain, chest tightness, palpitations or swelling in the hands or feet.  GI: Pt reports intermittent abdominal pain, nausea, vomiting and diarrhea. Denies blood in stool.  No other specific complaints in a complete review of systems (except as listed in HPI above).  Objective:   BP 112/68    Pulse 81    Ht 5\' 4"  (1.626 m)    Wt 223 lb (101.2 kg)    SpO2 100%    BMI 38.28 kg/m    General: Appears her stated age, in NAD. HEENT: Head: normal shape and size, maxillary sinus tenderness noted; Eyes: sclera white, no icterus, conjunctiva pink; Ears: Tm's gray and intact, normal light reflex; Throat/Mouth: + PND. Teeth present,  mucosa erythematous and moist, no exudate noted, no lesions or ulcerations noted.  Neck:  No adenopathy noted.  Cardiovascular: Normal rate and rhythm. S1,S2 noted.  No murmur, rubs or gallops noted.  Pulmonary/Chest: Normal effort and positive vesicular breath sounds, intermittent wheeze noted. No respiratory distress. No rales or ronchi noted.  Abd: Normal bowel sounds.      Assessment & Plan:   Viral Sinusitis:  Can use a Neti Pot which can be purchased from your local drug store. Flonase 2 sprays each nostril for 3 days and then as needed. eRx for Pred taper x days Spiriva refilled Continue Albuterol as needed No indication for abx at this time  Gallstones:  Referral to general  surgery for further evaluation of elective cholecystectomy   RTC as needed or if symptoms persist. Webb Silversmith, NP

## 2022-01-01 NOTE — Patient Instructions (Signed)

## 2022-01-11 ENCOUNTER — Ambulatory Visit: Payer: Managed Care, Other (non HMO) | Admitting: Surgery

## 2022-01-13 ENCOUNTER — Encounter: Payer: Self-pay | Admitting: Surgery

## 2022-01-13 ENCOUNTER — Ambulatory Visit: Payer: Managed Care, Other (non HMO) | Admitting: Surgery

## 2022-01-13 ENCOUNTER — Telehealth: Payer: Self-pay | Admitting: Surgery

## 2022-01-13 ENCOUNTER — Ambulatory Visit: Payer: Self-pay | Admitting: Surgery

## 2022-01-13 ENCOUNTER — Other Ambulatory Visit: Payer: Self-pay

## 2022-01-13 VITALS — BP 114/73 | HR 91 | Temp 97.9°F | Ht 64.0 in | Wt 202.6 lb

## 2022-01-13 DIAGNOSIS — K8010 Calculus of gallbladder with chronic cholecystitis without obstruction: Secondary | ICD-10-CM

## 2022-01-13 NOTE — Telephone Encounter (Signed)
Outgoing call is made, left message for patient to call me so that we can go over some dates to schedule surgery.

## 2022-01-13 NOTE — Progress Notes (Signed)
Patient ID: Samantha Whitney, female   DOB: 1956-09-24, 66 y.o.   MRN: [id number]  Chief Complaint: Gallstones  History of Present Illness Samantha Whitney is a 66 y.o. female with a long history of known gallstones, more recently due to dietary changes and consideration of weight loss medication utilization concerns raised regarding addressing her gallstones.  Her pain is primarily right upper quadrant when it occurs.  She does have a fatty food intolerance, denies fevers and chills, denies acholic stools and jaundice.  No nausea or vomiting.  Past Medical History Past Medical History:  Diagnosis Date   Allergy    GERD (gastroesophageal reflux disease)       Past Surgical History:  Procedure Laterality Date   ANKLE SURGERY     CESAREAN SECTION     NO PAST SURGERIES     TONSILECTOMY, ADENOIDECTOMY, BILATERAL MYRINGOTOMY AND TUBES      Allergies  Allergen Reactions   Penicillins    Scallops [Shellfish Allergy]     Current Outpatient Medications  Medication Sig Dispense Refill   albuterol (VENTOLIN HFA) 108 (90 Base) MCG/ACT inhaler Inhale 1-2 puffs into the lungs every 6 (six) hours as needed for shortness of breath. 18 g 1   calcium carbonate (OSCAL) 1500 (600 Ca) MG TABS tablet Take 600 mg of elemental calcium by mouth daily with breakfast.      clobetasol (OLUX) 0.05 % topical foam apply twice daily as needed to affected areas scalp/ears for two weeks then apply twice daily on weekends only, Avoid applying to face, groin, and axilla. Use as directed. Risk of skin atrophy with long-term use reviewed. 50 g 0   conjugated estrogens (PREMARIN) vaginal cream Use a fingertip amount at opening of vagina nightly for vaginal dryness. 42.5 g 12   esomeprazole (NEXIUM) 40 MG capsule TAKE 1 CAPSULE(40 MG) BY MOUTH DAILY 90 capsule 0   ketoconazole (NIZORAL) 2 % shampoo apply three times per week, massage into scalp and leave in for 10 minutes before rinsing out 120 mL 2   Tiotropium Bromide  Monohydrate (SPIRIVA RESPIMAT) 1.25 MCG/ACT AERS Inhale 2 puffs into the lungs daily. 4 g 2   No current facility-administered medications for this visit.    Family History Family History  Problem Relation Age of Onset   Cancer Mother        ColoRectal   Cancer Father        Stomach Cancer      Social History Social History   Tobacco Use   Smoking status: Never   Smokeless tobacco: Never  Vaping Use   Vaping Use: Never used  Substance Use Topics   Alcohol use: Yes    Alcohol/week: 2.0 standard drinks    Types: 2 Glasses of wine per week    Comment: weekly   Drug use: Never        Review of Systems  Constitutional:  Positive for diaphoresis.  HENT: Negative.    Eyes: Negative.   Respiratory:  Positive for cough.   Cardiovascular: Negative.   Gastrointestinal: Negative.   Genitourinary: Negative.   Neurological: Negative.   Psychiatric/Behavioral: Negative.       Physical Exam Blood pressure 114/73, pulse 91, temperature 97.9 F (36.6 C), temperature source Oral, height 5\' 4"  (1.626 m), weight 202 lb 9.6 oz (91.9 kg), SpO2 97 %. Last Weight  Most recent update: 01/13/2022  2:57 PM    Weight  91.9 kg (202 lb 9.6 oz)  CONSTITUTIONAL: Well developed, and nourished, appropriately responsive and aware without distress.   EYES: Sclera non-icteric.   EARS, NOSE, MOUTH AND THROAT: Mask worn.    Hearing is intact to voice.  NECK: Trachea is midline, and there is no jugular venous distension.  LYMPH NODES:  Lymph nodes in the neck are not enlarged. RESPIRATORY:  Lungs are clear, and breath sounds are equal bilaterally. Normal respiratory effort without pathologic use of accessory muscles. CARDIOVASCULAR: Heart is regular in rate and rhythm. GI: The abdomen is  soft, nontender, and nondistended. There were no palpable masses. I did not appreciate hepatosplenomegaly. There were normal bowel sounds. MUSCULOSKELETAL:  Symmetrical muscle tone appreciated in  all four extremities.    SKIN: Skin turgor is normal. No pathologic skin lesions appreciated.  NEUROLOGIC:  Motor and sensation appear grossly normal.  Cranial nerves are grossly without defect. PSYCH:  Alert and oriented to person, place and time. Affect is appropriate for situation.  Data Reviewed I have personally reviewed what is currently available of the patient's imaging, recent labs and medical records.   Labs:  CBC Latest Ref Rng & Units 07/08/2020 03/07/2019  WBC 3.4 - 10.8 x10E3/uL 5.4 5.7  Hemoglobin 11.1 - 15.9 g/dL 12.7 13.3  Hematocrit 34.0 - 46.6 % 38.8 40.1  Platelets 150 - 450 x10E3/uL 278 272   CMP Latest Ref Rng & Units 10/27/2021 07/08/2020 03/07/2019  Glucose 70 - 99 mg/dL 100(H) 100(H) 99  BUN 8 - 27 mg/dL 14 17 25   Creatinine 0.57 - 1.00 mg/dL 0.86 0.79 0.86  Sodium 134 - 144 mmol/L 142 141 143  Potassium 3.5 - 5.2 mmol/L 4.9 4.5 4.9  Chloride 96 - 106 mmol/L 104 103 102  CO2 20 - 29 mmol/L 24 24 23   Calcium 8.7 - 10.3 mg/dL 9.6 9.6 9.5  Total Protein 6.0 - 8.5 g/dL 7.1 7.4 7.3  Total Bilirubin 0.0 - 1.2 mg/dL 0.3 0.3 0.3  Alkaline Phos 44 - 121 IU/L 61 54 47  AST 0 - 40 IU/L 19 21 31   ALT 0 - 32 IU/L 15 17 34(H)      Imaging: Radiology review:  CLINICAL DATA:  Right upper quadrant pain   EXAM: ULTRASOUND ABDOMEN LIMITED RIGHT UPPER QUADRANT   COMPARISON:  None.   FINDINGS: Gallbladder:   Diffuse shadowing in the gallbladder presumably due to multiple stones. Negative sonographic Murphy. Normal wall thickness   Common bile duct:   Diameter: 4.6 mm   Liver:   Liver slightly echogenic. No focal hepatic abnormality. Portal vein is patent on color Doppler imaging with normal direction of blood flow towards the liver.   Other: None.   IMPRESSION: 1. Diffuse shadowing at the gallbladder fossa, felt secondary to stone filled gallbladder. Negative for acute cholecystitis 2. Liver slightly echogenic as may be seen with steatosis      Electronically Signed   By: Donavan Foil M.D.   On: 10/27/2021 22:37 Within last 24 hrs: No results found.  Assessment     Patient Active Problem List   Diagnosis Date Noted   CCC (chronic calculous cholecystitis) 01/13/2022   Prediabetes 10/08/2021   Pure hypercholesterolemia 10/08/2021   Class 1 obesity due to excess calories with serious comorbidity and body mass index (BMI) of 34.0 to 34.9 in adult 10/08/2021   GERD (gastroesophageal reflux disease) 03/16/2020    Plan    Robotic cholecystectomy with ICG imaging. This was discussed thoroughly.  Optimal plan is for robotic cholecystectomy.  Risks and benefits have  been discussed with the patient which include but are not limited to anesthesia, bleeding, infection, biliary ductal injury or stenosis, other associated unanticipated injuries affiliated with laparoscopic surgery.  I believe there is the desire to proceed, interpreter utilized as needed.  Questions elicited and answered to satisfaction.  No guarantees ever expressed or implied.   Face-to-face time spent with the patient and accompanying care providers(if present) was 30 minutes, with more than 50% of the time spent counseling, educating, and coordinating care of the patient.    These notes generated with voice recognition software. I apologize for typographical errors.  Ronny Bacon M.D., FACS 01/13/2022, 3:43 PM

## 2022-01-13 NOTE — Patient Instructions (Addendum)
Our surgery scheduler will call you within 24-48 hours to schedule your surgery. Please have the [facility] surgery sheet available when speaking with her.    Cholelithiasis Cholelithiasis happens when gallstones form in the gallbladder. The gallbladder stores bile. Bile is a fluid that helps digest fats. Bile can harden and form into gallstones. If they cause a blockage, they can cause pain (gallbladder attack). What are the causes? This condition may be caused by: Some blood diseases, such as sickle cell anemia. Too much of a fat-like substance (cholesterol) in your bile. Not enough bile salts in your bile. These salts help the body absorb and digest fats. The gallbladder not emptying fully or often enough. This is common in pregnant women. What increases the risk? The following factors may make you more likely to develop this condition: Being female. Being pregnant many times. Eating a lot of fried foods, fat, and refined carbs (refined carbohydrates). Being very overweight (obese). Being older than age 68. Using medicines with female hormones in them for a long time. Losing weight fast. Having gallstones in your family. Having some health problems, such as diabetes, Crohn's disease, or liver disease. What are the signs or symptoms? Often, there may be gallstones but no symptoms. These gallstones are called silent gallstones. If a gallstone causes a blockage, you may get sudden pain. The pain: Can be in the upper right part of your belly (abdomen). Normally comes at night or after you eat. Can last an hour or more. Can spread to your right shoulder, back, or chest. Can feel like discomfort, burning, or fullness in the upper part of your belly (indigestion). If the blockage lasts more than a few hours, you can get an infection or swelling. You may: Feel like you may vomit. Vomit. Feel bloated. Have belly pain for 5 hours or more. Feel tender in your belly, often in the upper right part  and under your ribs. Have fever or chills. Have skin or the white parts of your eyes turn yellow (jaundice). Have dark pee (urine) or pale poop (stool). How is this treated? Treatment for this condition depends on how bad you feel. If you have symptoms, you may need: Home care, if symptoms are not very bad. Do not eat for 12-24 hours. Drink only water and clear liquids. Start to eat simple or clear foods after 1 or 2 days. Try broths and crackers. You may need medicines for pain or stomach upset or both. If you have an infection, you will need antibiotics. A hospital stay, if you have very bad pain or a very bad infection. Surgery to remove your gallbladder. You may need this if: Gallstones keep coming back. You have very bad symptoms. Medicines to break up gallstones. Medicines: Are best for small gallstones. May be used for up to 6-12 months. A procedure to find and take out gallstones or to break up gallstones. Follow these instructions at home: Medicines Take over-the-counter and prescription medicines only as told by your doctor. If you were prescribed an antibiotic medicine, take it as told by your doctor. Do not stop taking the antibiotic even if you start to feel better. Ask your doctor if the medicine prescribed to you requires you to avoid driving or using machinery. Eating and drinking Drink enough fluid to keep your urine pale yellow. Drink water or clear fluids. This is important when you have pain. Eat healthy foods. Choose: Fewer fatty foods, such as fried foods. Fewer refined carbs. Avoid breads and grains that  are highly processed, such as white bread and white rice. Choose whole grains, such as whole-wheat bread and brown rice. More fiber. Almonds, fresh fruit, and beans are healthy sources. General instructions Keep a healthy weight. Keep all follow-up visits as told by your doctor. This is important. Where to find more information [employer] of Diabetes  and Digestive and Kidney Diseases: [email or web address] Contact a doctor if: You have sudden pain in the upper right part of your belly. Pain might spread to your right shoulder, back, or chest. You have been diagnosed with gallstones that have no symptoms and you get: Belly pain. Discomfort, burning, or fullness in the upper part of your abdomen. You have dark urine or pale stools. Get help right away if: You have sudden pain in the upper right part of your abdomen, and the pain lasts more than 2 hours. You have pain in your abdomen, and: It lasts more than 5 hours. It keeps getting worse. You have a fever or chills. You keep feeling like you may vomit. You keep vomiting. Your skin or the white parts of your eyes turn yellow. Summary Cholelithiasis happens when gallstones form in the gallbladder. This condition may be caused by a blood disease, too much of a fat-like substance in the bile, or not enough bile salts in bile. Treatment for this condition depends on how bad you feel. If you have symptoms, do not eat or drink. You may need medicines. You may need a hospital stay for very bad pain or a very bad infection. You may need surgery if gallstones keep coming back or if you have very bad symptoms. This information is not intended to replace advice given to you by your health care provider. Make sure you discuss any questions you have with your health care provider. Document Revised: 01/24/2020 Document Reviewed: 10/28/2019 Elsevier Patient Education  2022 [place] If you have a gallbladder condition, you may have trouble digesting fats. Eating a low-fat diet can help reduce your symptoms, and may be helpful before and after having surgery to remove your gallbladder (cholecystectomy). Your health care provider may recommend that you work with a diet and nutrition specialist (dietitian) to help you reduce the amount of fat in your diet. What are tips for  following this plan? General guidelines Limit your fat intake to less than 30% of your total daily calories. If you eat around 1,800 calories each day, this is less than 60 grams (g) of fat per day. Fat is an important part of a healthy diet. Eating a low-fat diet can make it hard to maintain a healthy body weight. Ask your dietitian how much fat, calories, and other nutrients you need each day. Eat small, frequent meals throughout the day instead of three large meals. Drink at least 8-10 cups of fluid a day. Drink enough fluid to keep your urine clear or pale yellow. Limit alcohol intake to no more than 1 drink a day for nonpregnant women and 2 drinks a day for men. One drink equals 12 oz of beer, 5 oz of wine, or 1 oz of hard liquor. Reading food labels  Check Nutrition Facts on food labels for the amount of fat per serving. Choose foods with less than 3 grams of fat per serving. Shopping Choose nonfat and low-fat healthy foods. Look for the words "nonfat," "low fat," or "fat free." Avoid buying processed or prepackaged foods. Cooking Cook using low-fat methods, such as baking, broiling, grilling, or boiling. Lacinda Axon  with small amounts of healthy fats, such as olive oil, grapeseed oil, canola oil, or sunflower oil. What foods are recommended? All fresh, frozen, or canned fruits and vegetables. Whole grains. Low-fat or non-fat (skim) milk and yogurt. Lean meat, skinless poultry, fish, eggs, and beans. Low-fat protein supplement powders or drinks. Spices and herbs. What foods are not recommended? High-fat foods. These include baked goods, fast food, fatty cuts of meat, ice cream, french toast, sweet rolls, pizza, cheese bread, foods covered with butter, creamy sauces, or cheese. Fried foods. These include french fries, tempura, battered fish, breaded chicken, fried breads, and sweets. Foods with strong odors. Foods that cause bloating and gas. Summary A low-fat diet can be helpful if you  have a gallbladder condition, or before and after gallbladder surgery. Limit your fat intake to less than 30% of your total daily calories. This is about 60 g of fat if you eat 1,800 calories each day. Eat small, frequent meals throughout the day instead of three large meals. This information is not intended to replace advice given to you by your health care provider. Make sure you discuss any questions you have with your health care provider. Document Revised: 07/17/2020 Document Reviewed: 07/23/2020 Elsevier Patient Education  2022 [employer].

## 2022-01-18 ENCOUNTER — Telehealth: Payer: Self-pay | Admitting: Surgery

## 2022-01-18 NOTE — Telephone Encounter (Signed)
Outgoing call is made, left message for patient to call, please inform of the following regarding scheduled surgery:   Pre-Admission date/time, COVID Testing date and Surgery date.  Surgery Date: 02/18/22 Preadmission Testing Date: 02/09/22 (phone 8a-1p) Covid Testing Date: Not needed.    Also patient will need to call at [phone], between 1-3:00pm the day before surgery, to find out what time to arrive for surgery.

## 2022-01-18 NOTE — Telephone Encounter (Signed)
Patient calls back, she is now informed of all dates regarding her surgery and verbalized understanding.   

## 2022-01-24 ENCOUNTER — Telehealth: Payer: Self-pay | Admitting: Surgery

## 2022-01-24 NOTE — Telephone Encounter (Signed)
Updated information regarding rescheduled surgery at doctor's request.  Patient has been informed of the following update.    Patient has been advised of Pre-Admission date/time, COVID Testing date and Surgery date.  Surgery Date: 02/25/22 Preadmission Testing Date: 02/09/22 (phone 8a-1p) Covid Testing Date: Not needed.     Patient has been made aware to call [phone], between 1-3:00pm the day before surgery, to find out what time to arrive for surgery.

## 2022-01-31 HISTORY — PX: COLONOSCOPY: SHX174

## 2022-01-31 LAB — HM COLONOSCOPY

## 2022-02-09 ENCOUNTER — Inpatient Hospital Stay
Admission: RE | Admit: 2022-02-09 | Discharge: 2022-02-09 | Disposition: A | Discharge status: Home / Self Care | Payer: Managed Care, Other (non HMO) | Source: Ambulatory Visit

## 2022-02-09 HISTORY — DX: Bronchitis, not specified as acute or chronic: J40

## 2022-02-09 NOTE — Patient Instructions (Addendum)
Your procedure is scheduled on: Friday, March 10 Report to the Registration Desk on the 1st floor of the [facility]. To find out your arrival time, please call [phone] between 1PM - 3PM on: Thursday, March 9  REMEMBER: Instructions that are not followed completely may result in serious medical risk, up to and including death; or upon the discretion of your surgeon and anesthesiologist your surgery may need to be rescheduled.  Do not eat or drink after midnight the night before surgery.  No gum chewing, lozengers or hard candies.  TAKE THESE MEDICATIONS THE MORNING OF SURGERY WITH A SIP OF WATER:  Spiriva inhaler Esomeprazole (Nexium) - (take one the night before and one on the morning of surgery - helps to prevent nausea after surgery.)  Bring your albuterol inhaler to the hospital with you to use prior to surgery.  One week prior to surgery: starting March 3 Stop Anti-inflammatories (NSAIDS) such as Advil, Aleve, Ibuprofen, Motrin, Naproxen, Naprosyn and Aspirin based products such as Excedrin, Goodys Powder, BC Powder. Stop ANY OVER THE COUNTER supplements until after surgery. Stop vitamin E, aleve PM, multiple vitamin, calcium, probiotic. You may however, continue to take Tylenol if needed for pain up until the day of surgery.  No Alcohol for 24 hours before or after surgery.  No Smoking including e-cigarettes for 24 hours prior to surgery.  No chewable tobacco products for at least 6 hours prior to surgery.  No nicotine patches on the day of surgery.  Do not use any "recreational" drugs for at least a week prior to your surgery.  Please be advised that the combination of cocaine and anesthesia may have negative outcomes, up to and including death. If you test positive for cocaine, your surgery will be cancelled.  On the morning of surgery brush your teeth with toothpaste and water, you may rinse your mouth with mouthwash if you wish. Do not swallow any toothpaste or  mouthwash.  Use CHG Soap as directed on instruction sheet.  Do not wear jewelry, make-up, hairpins, clips or nail polish.  Do not wear lotions, powders, or perfumes.   Do not shave body from the neck down 48 hours prior to surgery just in case you cut yourself which could leave a site for infection.  Also, freshly shaved skin may become irritated if using the CHG soap.  Contact lenses, hearing aids and dentures may not be worn into surgery.  Do not bring valuables to the hospital. [facility] is not responsible for any missing/lost belongings or valuables.   Bring your C-PAP to the hospital with you in case you may have to spend the night.   Notify your doctor if there is any change in your medical condition (cold, fever, infection).  Wear comfortable clothing (specific to your surgery type) to the hospital.  After surgery, you can help prevent lung complications by doing breathing exercises.  Take deep breaths and cough every 1-2 hours. Your doctor may order a device called an Incentive Spirometer to help you take deep breaths. When coughing or sneezing, hold a pillow firmly against your incision with both hands. This is called splinting. Doing this helps protect your incision. It also decreases belly discomfort.  If you are being discharged the day of surgery, you will not be allowed to drive home. You will need a responsible adult (18 years or older) to drive you home and stay with you that night.   If you are taking public transportation, you will need to  have a responsible adult (18 years or older) with you. Please confirm with your physician that it is acceptable to use public transportation.   Please call the [facility] at [phone] if you have any questions about these instructions.  Surgery Visitation Policy:  Patients undergoing a surgery or procedure may have one family member or support person with them as long as that person is not COVID-19  positive or experiencing its symptoms.  That person may remain in the waiting area during the procedure and may rotate out with other people.

## 2022-02-10 ENCOUNTER — Other Ambulatory Visit: Payer: Self-pay

## 2022-02-10 ENCOUNTER — Encounter
Admission: RE | Admit: 2022-02-10 | Discharge: 2022-02-10 | Disposition: A | Discharge status: Home / Self Care | Payer: Managed Care, Other (non HMO) | Source: Ambulatory Visit | Attending: Surgery | Admitting: Surgery

## 2022-02-10 NOTE — Patient Instructions (Addendum)
Your procedure is scheduled on: Friday, March 10 Report to the Registration Desk on the 1st floor of the [facility]. To find out your arrival time, please call [phone] between 1PM - 3PM on: Thursday, March 9  REMEMBER: Instructions that are not followed completely may result in serious medical risk, up to and including death; or upon the discretion of your surgeon and anesthesiologist your surgery may need to be rescheduled.  Do not eat or drink after midnight the night before surgery.  No gum chewing, lozengers or hard candies.  TAKE THESE MEDICATIONS THE MORNING OF SURGERY WITH A SIP OF WATER:  Spiriva inhaler Esomeprazole (Nexium) - (take one the night before and one on the morning of surgery - helps to prevent nausea after surgery.)  Bring your albuterol inhaler to the hospital with you and use prior to surgery.  One week prior to surgery: starting March 10 Stop Anti-inflammatories (NSAIDS) such as Advil, Aleve, Ibuprofen, Motrin, Naproxen, Naprosyn and Aspirin based products such as Excedrin, Goodys Powder, BC Powder. Stop ANY OVER THE COUNTER supplements until after surgery. Stop Vitamin E, Aleve PM, multiple vitamin, calcium, probiotic You may however, continue to take Tylenol if needed for pain up until the day of surgery.  No Alcohol for 24 hours before or after surgery.  No Smoking including e-cigarettes for 24 hours prior to surgery.  No chewable tobacco products for at least 6 hours prior to surgery.  No nicotine patches on the day of surgery.  Do not use any "recreational" drugs for at least a week prior to your surgery.  Please be advised that the combination of cocaine and anesthesia may have negative outcomes, up to and including death. If you test positive for cocaine, your surgery will be cancelled.  On the morning of surgery brush your teeth with toothpaste and water, you may rinse your mouth with mouthwash if you wish. Do not swallow any toothpaste or  mouthwash.  Use CHG Soap as directed on instruction sheet.  Do not wear jewelry, make-up, hairpins, clips or nail polish.  Do not wear lotions, powders, or perfumes.   Do not shave body from the neck down 48 hours prior to surgery just in case you cut yourself which could leave a site for infection.  Also, freshly shaved skin may become irritated if using the CHG soap.  Contact lenses, hearing aids and dentures may not be worn into surgery.  Do not bring valuables to the hospital. [facility] is not responsible for any missing/lost belongings or valuables.   Notify your doctor if there is any change in your medical condition (cold, fever, infection).  Wear comfortable clothing (specific to your surgery type) to the hospital.  After surgery, you can help prevent lung complications by doing breathing exercises.  Take deep breaths and cough every 1-2 hours. Your doctor may order a device called an Incentive Spirometer to help you take deep breaths. When coughing or sneezing, hold a pillow firmly against your incision with both hands. This is called splinting. Doing this helps protect your incision. It also decreases belly discomfort.  If you are being discharged the day of surgery, you will not be allowed to drive home. You will need a responsible adult (18 years or older) to drive you home and stay with you that night.   If you are taking public transportation, you will need to have a responsible adult (18 years or older) with you. Please confirm with your physician that it is acceptable  to use public transportation.   Please call the [facility] at [phone] if you have any questions about these instructions.  Surgery Visitation Policy:  Patients undergoing a surgery or procedure may have one family member or support person with them as long as that person is not COVID-19 positive or experiencing its symptoms.  That person may remain in the waiting area during  the procedure and may rotate out with other people.

## 2022-02-10 NOTE — Pre-Procedure Instructions (Signed)
Pre-op labs ordered by Dr. Christian Mate. Patient states that she works at [employer] and wants to get labs done there. Instructed patient to call Dr. Christian Mate and get a script for the required labs he ordered; acknowledged understanding.

## 2022-02-11 ENCOUNTER — Encounter
Admission: RE | Admit: 2022-02-11 | Discharge: 2022-02-11 | Disposition: A | Discharge status: Home / Self Care | Payer: Managed Care, Other (non HMO) | Source: Ambulatory Visit | Attending: Surgery | Admitting: Surgery

## 2022-02-11 DIAGNOSIS — Z01818 Encounter for other preprocedural examination: Secondary | ICD-10-CM | POA: Insufficient documentation

## 2022-02-11 DIAGNOSIS — K8010 Calculus of gallbladder with chronic cholecystitis without obstruction: Secondary | ICD-10-CM | POA: Insufficient documentation

## 2022-02-18 ENCOUNTER — Other Ambulatory Visit: Payer: Self-pay

## 2022-02-18 DIAGNOSIS — K8010 Calculus of gallbladder with chronic cholecystitis without obstruction: Secondary | ICD-10-CM

## 2022-02-19 LAB — COMPREHENSIVE METABOLIC PANEL
ALT: 91 IU/L — ABNORMAL HIGH (ref 0–32)
AST: 27 IU/L (ref 0–40)
Albumin/Globulin Ratio: 1.6 (ref 1.2–2.2)
Albumin: 4.6 g/dL (ref 3.8–4.8)
Alkaline Phosphatase: 84 IU/L (ref 44–121)
BUN/Creatinine Ratio: 19 (ref 12–28)
BUN: 15 mg/dL (ref 8–27)
Bilirubin Total: 0.3 mg/dL (ref 0.0–1.2)
CO2: 23 mmol/L (ref 20–29)
Calcium: 9.9 mg/dL (ref 8.7–10.3)
Chloride: 100 mmol/L (ref 96–106)
Creatinine, Ser: 0.78 mg/dL (ref 0.57–1.00)
Globulin, Total: 2.8 g/dL (ref 1.5–4.5)
Glucose: 97 mg/dL (ref 70–99)
Potassium: 4.7 mmol/L (ref 3.5–5.2)
Sodium: 136 mmol/L (ref 134–144)
Total Protein: 7.4 g/dL (ref 6.0–8.5)
eGFR: 83 mL/min/{1.73_m2} (ref >59)

## 2022-02-19 LAB — CBC WITH DIFFERENTIAL/PLATELET
Basophils Absolute: 0.1 10*3/uL (ref 0.0–0.2)
Basos: 1 %
EOS (ABSOLUTE): 0.2 10*3/uL (ref 0.0–0.4)
Eos: 3 %
Hematocrit: 39.6 % (ref 34.0–46.6)
Hemoglobin: 13.7 g/dL (ref 11.1–15.9)
Immature Grans (Abs): 0 10*3/uL (ref 0.0–0.1)
Immature Granulocytes: 0 %
Lymphocytes Absolute: 1.9 10*3/uL (ref 0.7–3.1)
Lymphs: 31 %
MCH: 30.2 pg (ref 26.6–33.0)
MCHC: 34.6 g/dL (ref 31.5–35.7)
MCV: 87 fL (ref 79–97)
Monocytes Absolute: 0.5 10*3/uL (ref 0.1–0.9)
Monocytes: 8 %
Neutrophils Absolute: 3.5 10*3/uL (ref 1.4–7.0)
Neutrophils: 57 %
Platelets: 295 10*3/uL (ref 150–450)
RBC: 4.53 x10E6/uL (ref 3.77–5.28)
RDW: 12.5 % (ref 11.7–15.4)
WBC: 6.1 10*3/uL (ref 3.4–10.8)

## 2022-02-25 ENCOUNTER — Other Ambulatory Visit: Payer: Self-pay

## 2022-02-25 ENCOUNTER — Ambulatory Visit
Admission: RE | Admit: 2022-02-25 | Discharge: 2022-02-25 | Disposition: A | Discharge status: Home / Self Care | Payer: Managed Care, Other (non HMO) | Attending: Surgery | Admitting: Surgery

## 2022-02-25 ENCOUNTER — Ambulatory Visit: Payer: Managed Care, Other (non HMO) | Admitting: Urgent Care

## 2022-02-25 ENCOUNTER — Encounter: Payer: Self-pay | Admitting: Surgery

## 2022-02-25 ENCOUNTER — Encounter
Admission: RE | Disposition: A | Discharge status: Home / Self Care | Payer: Self-pay | Source: Home / Self Care | Attending: Surgery

## 2022-02-25 DIAGNOSIS — K8010 Calculus of gallbladder with chronic cholecystitis without obstruction: Secondary | ICD-10-CM | POA: Insufficient documentation

## 2022-02-25 DIAGNOSIS — K219 Gastro-esophageal reflux disease without esophagitis: Secondary | ICD-10-CM | POA: Diagnosis not present

## 2022-02-25 SURGERY — CHOLECYSTECTOMY, ROBOT-ASSISTED, LAPAROSCOPIC
Anesthesia: General

## 2022-02-25 MED ORDER — ROCURONIUM BROMIDE 100 MG/10ML IV SOLN
INTRAVENOUS | Status: DC | PRN
  Administered 2022-02-25: 50 mg via INTRAVENOUS

## 2022-02-25 MED ORDER — ONDANSETRON HCL 4 MG/2ML IJ SOLN
INTRAMUSCULAR | Status: DC | PRN
  Administered 2022-02-25: 4 mg via INTRAVENOUS

## 2022-02-25 MED ORDER — OXYCODONE HCL 5 MG PO TABS
ORAL_TABLET | ORAL | Status: AC
  Filled 2022-02-25: qty 1

## 2022-02-25 MED ORDER — INDOCYANINE GREEN 25 MG IV SOLR
2.5000 mg | Freq: Once | INTRAVENOUS | Status: AC
  Administered 2022-02-25: 2.5 mg via INTRAVENOUS
  Filled 2022-02-25: qty 1

## 2022-02-25 MED ORDER — FENTANYL CITRATE (PF) 100 MCG/2ML IJ SOLN
INTRAMUSCULAR | Status: AC
  Filled 2022-02-25: qty 2

## 2022-02-25 MED ORDER — SEVOFLURANE IN SOLN
RESPIRATORY_TRACT | Status: AC
  Filled 2022-02-25: qty 250

## 2022-02-25 MED ORDER — PROPOFOL 500 MG/50ML IV EMUL
INTRAVENOUS | Status: AC
  Filled 2022-02-25: qty 50

## 2022-02-25 MED ORDER — SODIUM CHLORIDE FLUSH 0.9 % IV SOLN
INTRAVENOUS | Status: AC
  Filled 2022-02-25: qty 10

## 2022-02-25 MED ORDER — KETOROLAC TROMETHAMINE 30 MG/ML IJ SOLN
INTRAMUSCULAR | Status: DC | PRN
Start: 2022-02-25 — End: 2022-02-25
  Administered 2022-02-25: 30 mg via INTRAVENOUS

## 2022-02-25 MED ORDER — ACETAMINOPHEN 10 MG/ML IV SOLN
1000.0000 mg | Freq: Once | INTRAVENOUS | Status: DC | PRN
  Administered 2022-02-25: 1000 mg via INTRAVENOUS

## 2022-02-25 MED ORDER — DEXMEDETOMIDINE HCL IN NACL 200 MCG/50ML IV SOLN
INTRAVENOUS | Status: DC | PRN
  Administered 2022-02-25: 20 ug via INTRAVENOUS

## 2022-02-25 MED ORDER — FENTANYL CITRATE (PF) 100 MCG/2ML IJ SOLN: 25.0000 ug | INTRAMUSCULAR | Status: DC | PRN

## 2022-02-25 MED ORDER — SUGAMMADEX SODIUM 200 MG/2ML IV SOLN
INTRAVENOUS | Status: DC | PRN
  Administered 2022-02-25: 200 mg via INTRAVENOUS

## 2022-02-25 MED ORDER — CHLORHEXIDINE GLUCONATE CLOTH 2 % EX PADS: 6.0000 | MEDICATED_PAD | Freq: Once | CUTANEOUS | Status: DC

## 2022-02-25 MED ORDER — LACTATED RINGERS IV SOLN: INTRAVENOUS | Status: DC | PRN

## 2022-02-25 MED ORDER — DEXAMETHASONE SODIUM PHOSPHATE 10 MG/ML IJ SOLN
INTRAMUSCULAR | Status: DC | PRN
Start: 2022-02-25 — End: 2022-02-25
  Administered 2022-02-25: 10 mg via INTRAVENOUS

## 2022-02-25 MED ORDER — CHLORHEXIDINE GLUCONATE 0.12 % MT SOLN
OROMUCOSAL | Status: AC
  Administered 2022-02-25: 15 mL via OROMUCOSAL
  Filled 2022-02-25: qty 15

## 2022-02-25 MED ORDER — CEFAZOLIN SODIUM-DEXTROSE 2-4 GM/100ML-% IV SOLN
2.0000 g | INTRAVENOUS | Status: AC
  Administered 2022-02-25: 2 g via INTRAVENOUS

## 2022-02-25 MED ORDER — OXYCODONE HCL 5 MG/5ML PO SOLN: 5.0000 mg | Freq: Once | ORAL | Status: AC | PRN

## 2022-02-25 MED ORDER — CEFAZOLIN SODIUM-DEXTROSE 2-4 GM/100ML-% IV SOLN
INTRAVENOUS | Status: AC
  Filled 2022-02-25: qty 100

## 2022-02-25 MED ORDER — CHLORHEXIDINE GLUCONATE 0.12 % MT SOLN: 15.0000 mL | Freq: Once | OROMUCOSAL | Status: AC

## 2022-02-25 MED ORDER — BUPIVACAINE-EPINEPHRINE (PF) 0.25% -1:200000 IJ SOLN
INTRAMUSCULAR | Status: DC | PRN
  Administered 2022-02-25: 30 mL

## 2022-02-25 MED ORDER — ORAL CARE MOUTH RINSE: 15.0000 mL | Freq: Once | OROMUCOSAL | Status: AC

## 2022-02-25 MED ORDER — PHENYLEPHRINE HCL-NACL 20-0.9 MG/250ML-% IV SOLN
INTRAVENOUS | Status: DC | PRN
  Administered 2022-02-25: 50 ug/min via INTRAVENOUS

## 2022-02-25 MED ORDER — LACTATED RINGERS IV SOLN: INTRAVENOUS | Status: DC

## 2022-02-25 MED ORDER — HYDROCODONE-ACETAMINOPHEN 5-325 MG PO TABS: 1.0000 | ORAL_TABLET | Freq: Four times a day (QID) | ORAL | 0 refills | Status: DC | PRN

## 2022-02-25 MED ORDER — FENTANYL CITRATE (PF) 100 MCG/2ML IJ SOLN
INTRAMUSCULAR | Status: DC | PRN
  Administered 2022-02-25 (×2): 50 ug via INTRAVENOUS

## 2022-02-25 MED ORDER — OXYCODONE HCL 5 MG PO TABS
5.0000 mg | ORAL_TABLET | Freq: Once | ORAL | Status: AC | PRN
  Administered 2022-02-25: 5 mg via ORAL

## 2022-02-25 MED ORDER — 0.9 % SODIUM CHLORIDE (POUR BTL) OPTIME
TOPICAL | Status: DC | PRN
  Administered 2022-02-25: 60 mL

## 2022-02-25 MED ORDER — PROPOFOL 10 MG/ML IV BOLUS
INTRAVENOUS | Status: DC | PRN
  Administered 2022-02-25: 150 mg via INTRAVENOUS

## 2022-02-25 MED ORDER — LIDOCAINE HCL (CARDIAC) PF 100 MG/5ML IV SOSY
PREFILLED_SYRINGE | INTRAVENOUS | Status: DC | PRN
  Administered 2022-02-25: 100 mg via INTRAVENOUS

## 2022-02-25 MED ORDER — BUPIVACAINE-EPINEPHRINE (PF) 0.25% -1:200000 IJ SOLN
INTRAMUSCULAR | Status: AC
  Filled 2022-02-25: qty 30

## 2022-02-25 MED ORDER — ACETAMINOPHEN 10 MG/ML IV SOLN
INTRAVENOUS | Status: AC
  Filled 2022-02-25: qty 100

## 2022-02-25 MED ORDER — CHLORHEXIDINE GLUCONATE CLOTH 2 % EX PADS
6.0000 | MEDICATED_PAD | Freq: Once | CUTANEOUS | Status: AC
  Administered 2022-02-25: 6 via TOPICAL

## 2022-02-25 MED ORDER — ONDANSETRON HCL 4 MG/2ML IJ SOLN: 4.0000 mg | Freq: Once | INTRAMUSCULAR | Status: DC | PRN

## 2022-02-25 MED ORDER — PHENYLEPHRINE HCL (PRESSORS) 10 MG/ML IV SOLN
INTRAVENOUS | Status: DC | PRN
  Administered 2022-02-25: 200 ug via INTRAVENOUS

## 2022-02-25 MED ORDER — PHENYLEPHRINE HCL-NACL 20-0.9 MG/250ML-% IV SOLN
INTRAVENOUS | Status: AC
  Filled 2022-02-25: qty 250

## 2022-02-25 SURGICAL SUPPLY — 49 items
BAG RETRIEVAL 10 (BASKET) ×1
CANNULA CAP OBTURATR AIRSEAL 8 (CAP) ×2 IMPLANT
CLIP LIGATING HEM O LOK PURPLE (MISCELLANEOUS) ×2 IMPLANT
COVER TIP SHEARS 8 DVNC (MISCELLANEOUS) ×1 IMPLANT
COVER TIP SHEARS 8MM DA VINCI (MISCELLANEOUS) ×1
DERMABOND ADVANCED (GAUZE/BANDAGES/DRESSINGS) ×1
DERMABOND ADVANCED .7 DNX12 (GAUZE/BANDAGES/DRESSINGS) ×1 IMPLANT
DRAPE ARM DVNC X/XI (DISPOSABLE) ×4 IMPLANT
DRAPE COLUMN DVNC XI (DISPOSABLE) ×1 IMPLANT
DRAPE DA VINCI XI ARM (DISPOSABLE) ×4
DRAPE DA VINCI XI COLUMN (DISPOSABLE) ×1
ELECT CAUTERY BLADE 6.4 (BLADE) ×2 IMPLANT
GLOVE SURG ORTHO LTX SZ7.5 (GLOVE) ×6 IMPLANT
GOWN STRL REUS W/ TWL LRG LVL3 (GOWN DISPOSABLE) ×2 IMPLANT
GOWN STRL REUS W/ TWL XL LVL3 (GOWN DISPOSABLE) ×2 IMPLANT
GOWN STRL REUS W/TWL LRG LVL3 (GOWN DISPOSABLE) ×3
GOWN STRL REUS W/TWL XL LVL3 (GOWN DISPOSABLE)
GRASPER SUT TROCAR 14GX15 (MISCELLANEOUS) IMPLANT
INFUSOR MANOMETER BAG 3000ML (MISCELLANEOUS) IMPLANT
IRRIGATION STRYKERFLOW (MISCELLANEOUS) IMPLANT
IRRIGATOR STRYKERFLOW (MISCELLANEOUS)
IRRIGATOR SUCT 8 DISP DVNC XI (IRRIGATION / IRRIGATOR) IMPLANT
IRRIGATOR SUCTION 8MM XI DISP (IRRIGATION / IRRIGATOR)
IV NS IRRIG 3000ML ARTHROMATIC (IV SOLUTION) IMPLANT
KIT PINK PAD W/HEAD ARE REST (MISCELLANEOUS) ×2
KIT PINK PAD W/HEAD ARM REST (MISCELLANEOUS) ×1 IMPLANT
KIT TURNOVER KIT A (KITS) ×2 IMPLANT
LABEL OR SOLS (LABEL) ×2 IMPLANT
MANIFOLD NEPTUNE II (INSTRUMENTS) ×2 IMPLANT
NDL INSUFFLATION 14GA 120MM (NEEDLE) IMPLANT
NEEDLE HYPO 22GX1.5 SAFETY (NEEDLE) ×2 IMPLANT
NEEDLE INSUFFLATION 14GA 120MM (NEEDLE) IMPLANT
NS IRRIG 500ML POUR BTL (IV SOLUTION) ×2 IMPLANT
PACK LAP CHOLECYSTECTOMY (MISCELLANEOUS) ×2 IMPLANT
PENCIL ELECTRO HAND CTR (MISCELLANEOUS) ×2 IMPLANT
SCISSORS METZENBAUM CVD 33 (INSTRUMENTS) ×1 IMPLANT
SEAL CANN UNIV 5-8 DVNC XI (MISCELLANEOUS) ×3 IMPLANT
SEAL XI 5MM-8MM UNIVERSAL (MISCELLANEOUS) ×3
SET TUBE FILTERED XL AIRSEAL (SET/KITS/TRAYS/PACK) ×2 IMPLANT
SOLUTION ELECTROLUBE (MISCELLANEOUS) ×2 IMPLANT
SPIKE FLUID TRANSFER (MISCELLANEOUS) ×2 IMPLANT
SUT MNCRL 4-0 (SUTURE) ×2
SUT MNCRL 4-0 27XMFL (SUTURE) ×2
SUT VICRYL 0 AB UR-6 (SUTURE) ×3 IMPLANT
SUTURE MNCRL 4-0 27XMF (SUTURE) ×1 IMPLANT
SYS BAG RETRIEVAL 10MM (BASKET) ×1
SYSTEM BAG RETRIEVAL 10MM (BASKET) ×1 IMPLANT
TROCAR Z-THREAD FIOS 11X100 BL (TROCAR) IMPLANT
WATER STERILE IRR 500ML POUR (IV SOLUTION) ×1 IMPLANT

## 2022-02-25 NOTE — Discharge Instructions (Signed)
AMBULATORY SURGERY  ?DISCHARGE INSTRUCTIONS ? ? ?The drugs that you were given will stay in your system until tomorrow so for the next 24 hours you should not: ? ?Drive an automobile ?Make any legal decisions ?Drink any alcoholic beverage ? ? ?You may resume regular meals tomorrow.  Today it is better to start with liquids and gradually work up to solid foods. ? ?You may eat anything you prefer, but it is better to start with liquids, then soup and crackers, and gradually work up to solid foods. ? ? ?Please notify your doctor immediately if you have any unusual bleeding, trouble breathing, redness and pain at the surgery site, drainage, fever, or pain not relieved by medication. ? ? ? ?Additional Instructions: ? ? ? ?Please contact your physician with any problems or Same Day Surgery at [phone], Monday through Friday 6 am to 4 pm, or [place] [facility] Main number at [phone].  ?

## 2022-02-25 NOTE — Anesthesia Procedure Notes (Signed)
Procedure Name: Intubation ?Date/Time: 02/25/2022 7:34 AM ?Performed by: Beverely Low, CRNA ?Pre-anesthesia Checklist: Patient identified, Patient being monitored, Timeout performed, Emergency Drugs available and Suction available ?Patient Re-evaluated:Patient Re-evaluated prior to induction ?Oxygen Delivery Method: Circle system utilized ?Preoxygenation: Pre-oxygenation with 100% oxygen ?Induction Type: IV induction ?Ventilation: Mask ventilation without difficulty ?Laryngoscope Size: 3 and McGraph ?Grade View: Grade I ?Tube type: Oral ?Tube size: 7.0 mm ?Number of attempts: 1 ?Airway Equipment and Method: Stylet ?Placement Confirmation: ETT inserted through vocal cords under direct vision, positive ETCO2 and breath sounds checked- equal and bilateral ?Secured at: 21 cm ?Tube secured with: Tape ?Dental Injury: Teeth and Oropharynx as per pre-operative assessment  ? ? ? ? ?

## 2022-02-25 NOTE — Anesthesia Postprocedure Evaluation (Signed)
Anesthesia Post Note ? ?Patient: Samantha Whitney ? ?Procedure(s) Performed: XI ROBOTIC ASSISTED LAPAROSCOPIC CHOLECYSTECTOMY ?INDOCYANINE GREEN FLUORESCENCE IMAGING (ICG) ? ?Patient location during evaluation: PACU ?Anesthesia Type: General ?Level of consciousness: awake and alert ?Pain management: pain level controlled ?Vital Signs Assessment: post-procedure vital signs reviewed and stable ?Respiratory status: spontaneous breathing, nonlabored ventilation, respiratory function stable and patient connected to nasal cannula oxygen ?Cardiovascular status: blood pressure returned to baseline and stable ?Postop Assessment: no apparent nausea or vomiting ?Anesthetic complications: no ? ? ?No notable events documented. ? ? ?Last Vitals:  ?Vitals:  ? 02/25/22 1000 02/25/22 1013  ?BP: 116/73 135/60  ?Pulse: 70 70  ?Resp: (!) 7 18  ?Temp: (!) 36.2 ?C (!) 36.1 ?C  ?SpO2: 95% 98%  ?  ?Last Pain:  ?Vitals:  ? 02/25/22 1013  ?TempSrc: Temporal  ?PainSc: 5   ? ? ?  ?  ?  ?  ?  ?  ? ?Arita Miss ? ? ? ? ?

## 2022-02-25 NOTE — Transfer of Care (Signed)
Immediate Anesthesia Transfer of Care Note ? ?Patient: Samantha Whitney ? ?Procedure(s) Performed: XI ROBOTIC ASSISTED LAPAROSCOPIC CHOLECYSTECTOMY ?INDOCYANINE GREEN FLUORESCENCE IMAGING (ICG) ? ?Patient Location: PACU ? ?Anesthesia Type:General ? ?Level of Consciousness: drowsy ? ?Airway & Oxygen Therapy: Patient Spontanous Breathing and Patient connected to face mask oxygen ? ?Post-op Assessment: Report given to RN and Post -op Vital signs reviewed and stable ? ?Post vital signs: Reviewed and stable ? ?Last Vitals:  ?Vitals Value Taken Time  ?BP    ?Temp    ?Pulse    ?Resp    ?SpO2    ? ? ?Last Pain:  ?Vitals:  ? 02/25/22 0624  ?TempSrc: Oral  ?PainSc: 0-No pain  ?   ? ?  ? ?Complications: No notable events documented. ?

## 2022-02-25 NOTE — H&P (Signed)
Chief Complaint: Gallstones ?  ?History of Present Illness ?Samantha Whitney is a 66 y.o. female with a long history of known gallstones, more recently due to dietary changes and consideration of weight loss medication utilization concerns raised regarding addressing her gallstones.  Her pain is primarily right upper quadrant when it occurs.  She does have a fatty food intolerance, denies fevers and chills, denies acholic stools and jaundice.  No nausea or vomiting. ?  ?Past Medical History ?    ?Past Medical History:  ?Diagnosis Date  ? Allergy    ? GERD (gastroesophageal reflux disease)    ?  ?  ?  ?     ?Past Surgical History:  ?Procedure Laterality Date  ? ANKLE SURGERY      ? CESAREAN SECTION      ? NO PAST SURGERIES      ? TONSILECTOMY, ADENOIDECTOMY, BILATERAL MYRINGOTOMY AND TUBES      ?  ?  ?    ?Allergies  ?Allergen Reactions  ? Penicillins    ? Scallops [Shellfish Allergy]    ?  ?  ?      ?Current Outpatient Medications  ?Medication Sig Dispense Refill  ? albuterol (VENTOLIN HFA) 108 (90 Base) MCG/ACT inhaler Inhale 1-2 puffs into the lungs every 6 (six) hours as needed for shortness of breath. 18 g 1  ? calcium carbonate (OSCAL) 1500 (600 Ca) MG TABS tablet Take 600 mg of elemental calcium by mouth daily with breakfast.       ? clobetasol (OLUX) 0.05 % topical foam apply twice daily as needed to affected areas scalp/ears for two weeks then apply twice daily on weekends only, Avoid applying to face, groin, and axilla. Use as directed. Risk of skin atrophy with long-term use reviewed. 50 g 0  ? conjugated estrogens (PREMARIN) vaginal cream Use a fingertip amount at opening of vagina nightly for vaginal dryness. 42.5 g 12  ? esomeprazole (NEXIUM) 40 MG capsule TAKE 1 CAPSULE(40 MG) BY MOUTH DAILY 90 capsule 0  ? ketoconazole (NIZORAL) 2 % shampoo apply three times per week, massage into scalp and leave in for 10 minutes before rinsing out 120 mL 2  ? Tiotropium Bromide Monohydrate (SPIRIVA RESPIMAT) 1.25  MCG/ACT AERS Inhale 2 puffs into the lungs daily. 4 g 2  ?  ?No current facility-administered medications for this visit.  ?  ?  ?Family History ?     ?Family History  ?Problem Relation Age of Onset  ? Cancer Mother    ?      ColoRectal  ? Cancer Father    ?      Stomach Cancer  ?  ?  ?  ?Social History ?Social History  ?  ?     ?Tobacco Use  ? Smoking status: Never  ? Smokeless tobacco: Never  ?Vaping Use  ? Vaping Use: Never used  ?Substance Use Topics  ? Alcohol use: Yes  ?    Alcohol/week: 2.0 standard drinks  ?    Types: 2 Glasses of wine per week  ?    Comment: weekly  ? Drug use: Never  ?  ?  ?  ?  ?Review of Systems  ?Constitutional:  Positive for diaphoresis.  ?HENT: Negative.    ?Eyes: Negative.   ?Respiratory:  Positive for cough.   ?Cardiovascular: Negative.   ?Gastrointestinal: Negative.   ?Genitourinary: Negative.   ?Neurological: Negative.   ?Psychiatric/Behavioral: Negative.    ?  ?  ?Physical Exam ?Blood pressure 114/73,  pulse 91, temperature 97.9 ?F (36.6 ?C), temperature source Oral, height '5\' 4"'$  (1.626 m), weight 202 lb 9.6 oz (91.9 kg), SpO2 97 %. ? ?CONSTITUTIONAL: Well developed, and nourished, appropriately responsive and aware without distress.   ?EYES: Sclera non-icteric.   ?EARS, NOSE, MOUTH AND THROAT: Mask worn.    Hearing is intact to voice.  ?NECK: Trachea is midline, and there is no jugular venous distension.  ?LYMPH NODES:  Lymph nodes in the neck are not enlarged. ?RESPIRATORY:  Lungs are clear, and breath sounds are equal bilaterally. Normal respiratory effort without pathologic use of accessory muscles. ?CARDIOVASCULAR: Heart is regular in rate and rhythm. ?GI: The abdomen is  soft, nontender, and nondistended. There were no palpable masses. I did not appreciate hepatosplenomegaly. There were normal bowel sounds. ?MUSCULOSKELETAL:  Symmetrical muscle tone appreciated in all four extremities.    ?SKIN: Skin turgor is normal. No pathologic skin lesions appreciated.  ?NEUROLOGIC:   Motor and sensation appear grossly normal.  Cranial nerves are grossly without defect. ?PSYCH:  Alert and oriented to person, place and time. Affect is appropriate for situation. ?  ?Data Reviewed ?I have personally reviewed what is currently available of the patient's imaging, recent labs and medical records.   ?Labs:  ?CBC Latest Ref Rng & Units 07/08/2020 03/07/2019  ?WBC 3.4 - 10.8 x10E3/uL 5.4 5.7  ?Hemoglobin 11.1 - 15.9 g/dL 12.7 13.3  ?Hematocrit 34.0 - 46.6 % 38.8 40.1  ?Platelets 150 - 450 x10E3/uL 278 272  ?  ?CMP Latest Ref Rng & Units 10/27/2021 07/08/2020 03/07/2019  ?Glucose 70 - 99 mg/dL 100(H) 100(H) 99  ?BUN 8 - 27 mg/dL '14 17 25  '$ ?Creatinine 0.57 - 1.00 mg/dL 0.86 0.79 0.86  ?Sodium 134 - 144 mmol/L 142 141 143  ?Potassium 3.5 - 5.2 mmol/L 4.9 4.5 4.9  ?Chloride 96 - 106 mmol/L 104 103 102  ?CO2 20 - 29 mmol/L '24 24 23  '$ ?Calcium 8.7 - 10.3 mg/dL 9.6 9.6 9.5  ?Total Protein 6.0 - 8.5 g/dL 7.1 7.4 7.3  ?Total Bilirubin 0.0 - 1.2 mg/dL 0.3 0.3 0.3  ?Alkaline Phos 44 - 121 IU/L 61 54 47  ?AST 0 - 40 IU/L '19 21 31  '$ ?ALT 0 - 32 IU/L 15 17 34(H)  ?  ?  ?  ?  ?Imaging: ?Radiology review:  ?CLINICAL DATA:  Right upper quadrant pain ?  ?EXAM: ?ULTRASOUND ABDOMEN LIMITED RIGHT UPPER QUADRANT ?  ?COMPARISON:  None. ?  ?FINDINGS: ?Gallbladder: ?  ?Diffuse shadowing in the gallbladder presumably due to multiple ?stones. Negative sonographic Murphy. Normal wall thickness ?  ?Common bile duct: ?  ?Diameter: 4.6 mm ?  ?Liver: ?  ?Liver slightly echogenic. No focal hepatic abnormality. Portal vein ?is patent on color Doppler imaging with normal direction of blood ?flow towards the liver. ?  ?Other: None. ?  ?IMPRESSION: ?1. Diffuse shadowing at the gallbladder fossa, felt secondary to ?stone filled gallbladder. Negative for acute cholecystitis ?2. Liver slightly echogenic as may be seen with steatosis ?  ?  ?Electronically Signed ?  By: Donavan Foil M.D. ?  On: 10/27/2021 22:37 ?Within last 24 hrs: No results found. ?   ?Assessment ?  ?Assessment ?   ?  ?    ?Patient Active Problem List  ?  Diagnosis Date Noted  ? 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 Prediabetes 10/08/2021  ? Pure hypercholesterolemia 10/08/2021  ? Class 1 obesity due to excess calories with serious comorbidity and body mass index (BMI)  of 34.0 to 34.9 in adult 10/08/2021  ? GERD (gastroesophageal reflux disease) 03/16/2020  ?  ?  ?Plan ?  ?  ?Plan ?   ?Robotic cholecystectomy with ICG imaging. ?This was discussed thoroughly.  Optimal plan is for robotic cholecystectomy.  Risks and benefits have been discussed with the patient which include but are not limited to anesthesia, bleeding, infection, biliary ductal injury or stenosis, other associated unanticipated injuries affiliated with laparoscopic surgery.  I believe there is the desire to proceed, interpreter utilized as needed.  Questions elicited and answered to satisfaction.  No guarantees ever expressed or implied. ?  ?  ?Face-to-face time spent with the patient and accompanying care providers(if present) was 30 minutes, with more than 50% of the time spent counseling, educating, and coordinating care of the patient.   ?  ?These notes generated with voice recognition software. I apologize for typographical errors. ?  ?Ronny Bacon, M.D., FACS ?[facility] ? ?02/25/2022 ; 7:19 AM ? ?

## 2022-02-25 NOTE — Interval H&P Note (Signed)
History and Physical Interval Note: ? ?02/25/2022 ?7:20 AM ? ?Samantha Whitney  has presented today for surgery, with the diagnosis of chronic calculous cholecystitis.  The various methods of treatment have been discussed with the patient and family. After consideration of risks, benefits and other options for treatment, the patient has consented to  Procedure(s): ?XI ROBOTIC ASSISTED LAPAROSCOPIC CHOLECYSTECTOMY (N/A) ?INDOCYANINE GREEN FLUORESCENCE IMAGING (ICG) (N/A) as a surgical intervention.  The patient's history has been reviewed, patient examined, no change in status, stable for surgery.  I have reviewed the patient's chart and labs.  Questions were answered to the patient's satisfaction.   ? ? ?Ronny Bacon ? ? ?

## 2022-02-25 NOTE — Anesthesia Preprocedure Evaluation (Signed)
Anesthesia Evaluation  ?Patient identified by MRN, date of birth, ID band ?Patient awake ? ? ? ?Reviewed: ?Allergy & Precautions, NPO status , Patient's Chart, lab work & pertinent test results ? ?History of Anesthesia Complications ?Negative for: history of anesthetic complications ? ?Airway ?Mallampati: II ? ?TM Distance: >3 FB ?Neck ROM: Full ? ? ? Dental ?no notable dental hx. ?(+) Teeth Intact ?  ?Pulmonary ?asthma , neg sleep apnea, neg COPD, Patient abstained from smoking.Not current smoker,  ?Not formally diagnosed with asthma, but patient says she has "asthmatic tendencies". Had bronchitis in January, resolved. Does take daily spiriva. Never hospitalized for asthma attacks. ?  ?Pulmonary exam normal ?breath sounds clear to auscultation ? ? ? ? ? ? Cardiovascular ?Exercise Tolerance: Good ?METS(-) hypertension(-) CAD and (-) Past MI negative cardio ROS ? ?(-) dysrhythmias  ?Rhythm:Regular Rate:Normal ?- Systolic murmurs ? ?  ?Neuro/Psych ?negative neurological ROS ? negative psych ROS  ? GI/Hepatic ?GERD  Medicated and Controlled,(+)  ?  ? (-) substance abuse ? ,   ?Endo/Other  ?neg diabetes ? Renal/GU ?negative Renal ROS  ? ?  ?Musculoskeletal ? ? Abdominal ?  ?Peds ? Hematology ?  ?Anesthesia Other Findings ?Past Medical History: ?No date: Allergy ?No date: Bronchitis ?No date: GERD (gastroesophageal reflux disease) ? Reproductive/Obstetrics ? ?  ? ? ? ? ? ? ? ? ? ? ? ? ? ?  ?  ? ? ? ? ? ? ? ? ?Anesthesia Physical ?Anesthesia Plan ? ?ASA: 2 ? ?Anesthesia Plan: General  ? ?Post-op Pain Management: Ofirmev IV (intra-op)* and Toradol IV (intra-op)*  ? ?Induction: Intravenous ? ?PONV Risk Score and Plan: 4 or greater and Ondansetron, Dexamethasone and Midazolam ? ?Airway Management Planned: Oral ETT ? ?Additional Equipment: None ? ?Intra-op Plan:  ? ?Post-operative Plan: Extubation in OR ? ?Informed Consent: I have reviewed the patients History and Physical, chart, labs and  discussed the procedure including the risks, benefits and alternatives for the proposed anesthesia with the patient or authorized representative who has indicated his/her understanding and acceptance.  ? ? ? ?Dental advisory given ? ?Plan Discussed with: CRNA and Surgeon ? ?Anesthesia Plan Comments: (Discussed risks of anesthesia with patient, including PONV, sore throat, lip/dental/eye damage. Rare risks discussed as well, such as cardiorespiratory and neurological sequelae, and allergic reactions. Discussed the role of CRNA in patient's perioperative care. Patient understands.)  ? ? ? ? ? ? ?Anesthesia Quick Evaluation ? ?

## 2022-02-25 NOTE — Op Note (Signed)
Robotic cholecystectomy with Indocyamine Green Ductal Imaging.  ? ?Pre-operative Diagnosis: Chronic calculus cholecystitis ? ?Post-operative Diagnosis:  Same. ? ?Procedure: Robotic assisted laparoscopic cholecystectomy with Indocyamine Green Ductal Imaging.  ? ?Surgeon: Ronny Bacon, M.D., FACS ? ?Anesthesia: General. with endotracheal tube ? ?Findings: as expected.  ? ?Estimated Blood Loss: 10 mL ?        ?Drains: None ?        ?Specimens: Gallbladder     ?      ?Complications: none ? ?Procedure Details  ?The patient was seen again in the Holding Room.  2.5 mg dose of ICG was administered intravenously.   ?The benefits, complications, treatment options, risks and expected outcomes were again reviewed with the patient. The likelihood of improving the patient's symptoms with return to their baseline status is good.  The patient and/or family concurred with the proposed plan, giving informed consent, again alternatives reviewed.  The patient was taken to Operating Room, identified, and the procedure verified as robotic assisted laparoscopic cholecystectomy. ? ?Prior to the induction of general anesthesia, antibiotic prophylaxis was administered. VTE prophylaxis was in place. General endotracheal anesthesia was then administered and tolerated well. The patient was positioned in the supine position.  After the induction, the abdomen was prepped with Chloraprep and draped in the sterile fashion.  ?A Time Out was held and the above information confirmed. ? ?After local infiltration of quarter percent Marcaine with epinephrine, stab incision was made left upper quadrant.  Just below the costal margin at Palmer's point, approximately midclavicular line the Veres needle is passed with sensation of the layers to penetrate the abdominal wall and into the peritoneum.  Saline drop test is confirmed peritoneal placement.  Insufflation is initiated with carbon dioxide to pressures of 15 mmHg. ? ?Right infra-umbilical local  infiltration with quarter percent Marcaine with epinephrine is utilized.  Made a 12 mm incision on the right periumbilical site, I advanced an optical 52m port under direct visualization into the peritoneal cavity.  Once the peritoneum was penetrated, insufflation was initiated.  The trocar was then advanced into the abdominal cavity under direct visualization. ?Pneumoperitoneum was then continued with Air seal utilizing CO2 at 15 mmHg or less and tolerated well without any adverse changes in the patient's vital signs.  Two 8.5-mm ports were placed in the left lower quadrant and laterally, and one to the right lower quadrant, all under direct vision. All skin incisions  were infiltrated with a local anesthetic agent before making the incision and placing the trocars.  ?The patient was positioned  in reverse Trendelenburg, tilted the patient's left side down.  Da Vinci XI robot was then positioned on to the patient's left side, and docked. ? ?The gallbladder was identified, the fundus grasped via the arm 4 Prograsp and retracted cephalad. Adhesions were lysed with scissors and cautery.  The infundibulum was identified grasped and retracted laterally, exposing the peritoneum overlying the triangle of Calot. This was then opened and dissected using cautery & scissors. An extended critical view of the cystic duct and cystic artery was obtained, aided by the ICG via FireFly which enabled ready visualization of the ductal anatomy.   ? ? ?The cystic duct was clearly identified and dissected to isolation.   Artery well isolated and clipped, and the cystic duct was triple clipped and divided with scissors, as close to the gallbladder neck as feasible, thus leaving two on the remaining stump.  The specimen side of the artery is sealed with bipolar and divided  with monopolar scissors.  ? ?The gallbladder was taken from the gallbladder fossa in a retrograde fashion with the electrocautery. The gallbladder was removed and  placed in an Endocatch bag.  ?The liver bed is inspected. Hemostasis was confirmed.  ?The robot was undocked and moved away from the operative field. ?No irrigation was utilized. The gallbladder and Endocatch sac were then removed through the infraumbilical port site where dilation was needed for large stone extraction.  ? ?Inspection of the right upper quadrant was performed. No bleeding, bile duct injury or leak, or bowel injury was noted. Pneumoperitoneum was released.  The infra-umbilical port site fascia was closed with running 0 Vicryl sutures under direct visualization and ports removed.  4-0 subcuticular Monocryl was used to close the skin. Dermabond was  applied.  The patient was then extubated and brought to the recovery room in stable condition. Sponge, lap, and needle counts were correct at closure and at the conclusion of the case.  ?        ?     ?Ronny Bacon, M.D., FACS ?02/25/2022 9:18 AM ? ?

## 2022-03-02 LAB — SURGICAL PATHOLOGY

## 2022-03-10 ENCOUNTER — Ambulatory Visit (INDEPENDENT_AMBULATORY_CARE_PROVIDER_SITE_OTHER): Payer: Managed Care, Other (non HMO) | Admitting: Surgery

## 2022-03-10 ENCOUNTER — Encounter: Payer: Self-pay | Admitting: Surgery

## 2022-03-10 ENCOUNTER — Other Ambulatory Visit: Payer: Self-pay

## 2022-03-10 ENCOUNTER — Encounter: Payer: Managed Care, Other (non HMO) | Admitting: Physician Assistant

## 2022-03-10 VITALS — BP 113/78 | HR 80 | Temp 97.9°F | Ht 64.0 in | Wt 200.6 lb

## 2022-03-10 DIAGNOSIS — Z09 Encounter for follow-up examination after completed treatment for conditions other than malignant neoplasm: Secondary | ICD-10-CM

## 2022-03-10 DIAGNOSIS — Z9049 Acquired absence of other specified parts of digestive tract: Secondary | ICD-10-CM | POA: Insufficient documentation

## 2022-03-10 NOTE — Progress Notes (Signed)
[facility] ?POST-OP OFFICE VISIT ? ?03/10/2022 ? ?HPI: ?Samantha Whitney is a 66 y.o. female 13 days s/p robotic cholecystectomy.  She is well aware of her pathology, with the identification of some dysplasia.  We discussed what reasonable follow-up/screening would be regarding this.  We believe that since inflammatory sources gone, and the dysplasia appeared to be somewhat limited.  Without any other evidence of anatomic pathology, that routine imaging would not be indicated.  She is otherwise doing great having no complaints or issues. ? ?Vital signs: ?BP 113/78   Pulse 80   Temp 97.9 ?F (36.6 ?C) (Oral)   Ht '5\' 4"'$  (1.626 m)   Wt 200 lb 9.6 oz (91 kg)   SpO2 98%   BMI 34.43 kg/m?   ? ?Physical Exam: ?Constitutional: She appears well. ?Abdomen: She sits up with minimal discomfort.  Her periumbilical incision is the thickest with subcutaneous tissue healing.  There is associated ecchymosis around the area, but no induration, none tender. ?Skin: Incisions are clean dry and intact.  Be healing well. ? ?Assessment/Plan: ?This is a 65 y.o. female 13 days s/p robotic cholecystectomy ? ?Patient Active Problem List  ? Diagnosis Date Noted  ? 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 Prediabetes 10/08/2021  ? Pure hypercholesterolemia 10/08/2021  ? Class 1 obesity due to excess calories with serious comorbidity and body mass index (BMI) of 34.0 to 34.9 in adult 10/08/2021  ? GERD (gastroesophageal reflux disease) 03/16/2020  ? ? -She may follow-up with [place] as needed.  I encouraged her that she may resume some low impact exercise/activity.  Which she is looking forward to resuming.  And I believe she understands what what weight lifting and straining to avoid.  And that this would continue for another month.  We will be glad to see her back as needed. ? ? ?Ronny Bacon M.D., FACS ?03/10/2022, 11:27 AM ? ?

## 2022-03-10 NOTE — Patient Instructions (Signed)

## 2022-03-28 ENCOUNTER — Other Ambulatory Visit: Payer: Self-pay | Admitting: Internal Medicine

## 2022-03-28 DIAGNOSIS — K219 Gastro-esophageal reflux disease without esophagitis: Secondary | ICD-10-CM

## 2022-03-28 NOTE — Telephone Encounter (Signed)
Requested Prescriptions  ?Pending Prescriptions Disp Refills  ?? esomeprazole (NEXIUM) 40 MG capsule [Pharmacy Med Name: ESOMEPRAZOLE MAGNESIUM '40MG'$  DR CAPS] 90 capsule 0  ?  Sig: TAKE 1 CAPSULE(40 MG) BY MOUTH DAILY  ?  ? Gastroenterology: Proton Pump Inhibitors 2 Failed - 03/28/2022  3:14 AM  ?  ?  Failed - ALT in normal range and within 360 days  ?  ALT  ?Date Value Ref Range Status  ?02/18/2022 91 (H) 0 - 32 IU/L Final  ?   ?  ?  Passed - AST in normal range and within 360 days  ?  AST  ?Date Value Ref Range Status  ?02/18/2022 27 0 - 40 IU/L Final  ?   ?  ?  Passed - Valid encounter within last 12 months  ?  Recent Outpatient Visits   ?      ? 2 months ago Gallstones  ? [facility] [place], NP  ? 5 months ago RUQ pain  ? [facility] [place], Coralie Keens, NP  ? 1 year ago Bronchitis  ? [place], FNP  ? 1 year ago Bronchitis  ? [place], FNP  ? 1 year ago Diarrhea of infectious origin  ? [facility], Lupita Raider, FNP  ?  ?  ?Future Appointments   ?        ? In 2 months Laurence Ferrari, [place], [place]  ?  ? ?  ?  ?  ? ?

## 2022-04-27 ENCOUNTER — Other Ambulatory Visit: Payer: Self-pay

## 2022-04-27 DIAGNOSIS — J40 Bronchitis, not specified as acute or chronic: Secondary | ICD-10-CM

## 2022-04-27 MED ORDER — SPIRIVA RESPIMAT 1.25 MCG/ACT IN AERS: 2.0000 | INHALATION_SPRAY | Freq: Every day | RESPIRATORY_TRACT | 2 refills | Status: DC

## 2022-04-28 IMAGING — US US ABDOMEN LIMITED
1 series · 14 of 25 positions shown · non-contrast
Comparison: None.

CLINICAL DATA: Right upper quadrant pain

EXAM:
ULTRASOUND ABDOMEN LIMITED RIGHT UPPER QUADRANT

[Series 1: us abdomen limited · 0.22mm/px · 14 of 57 slices shown]
[im 1/57]
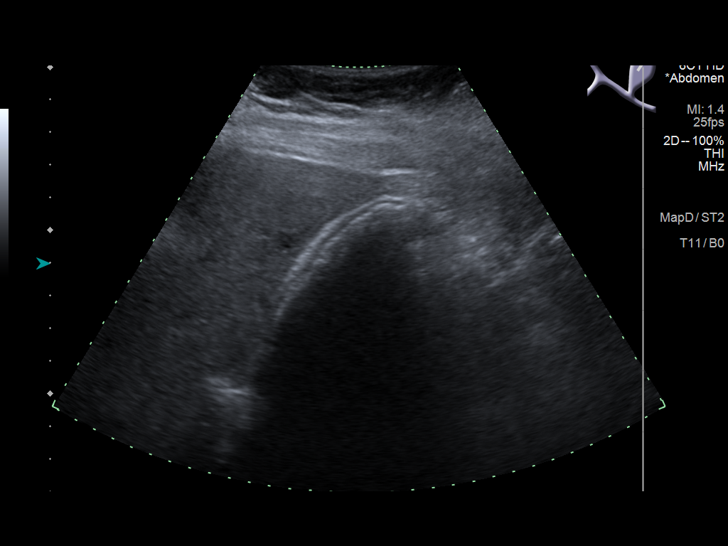
[im 5/57]
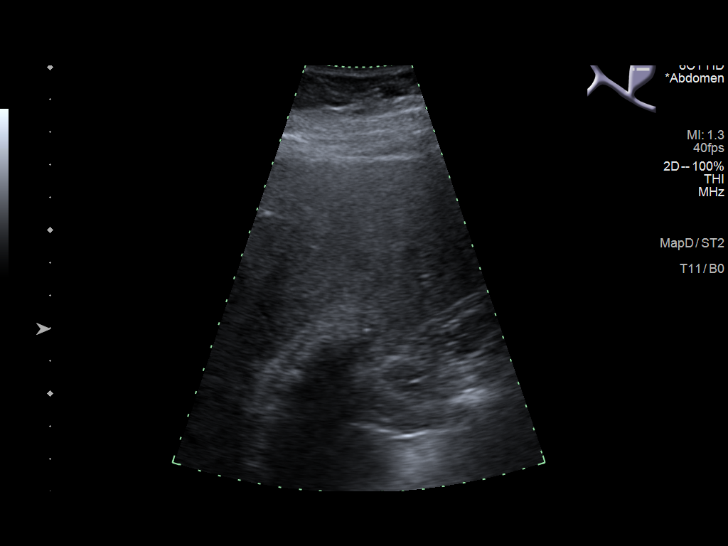
[im 10/57]
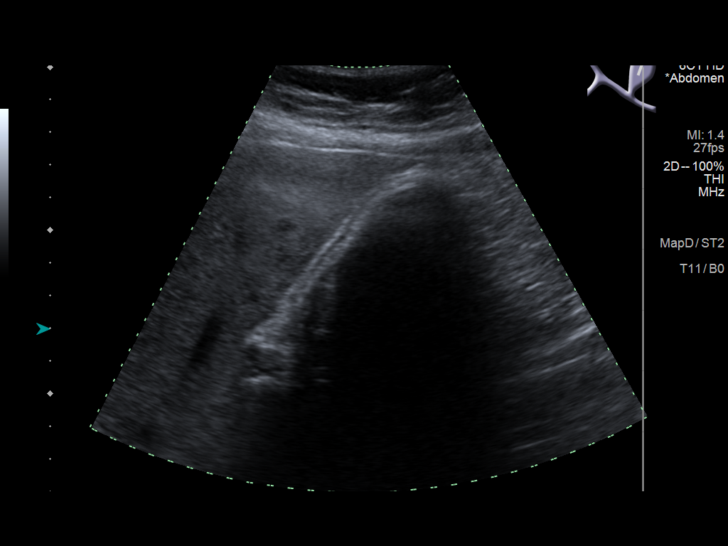
[im 15/57]
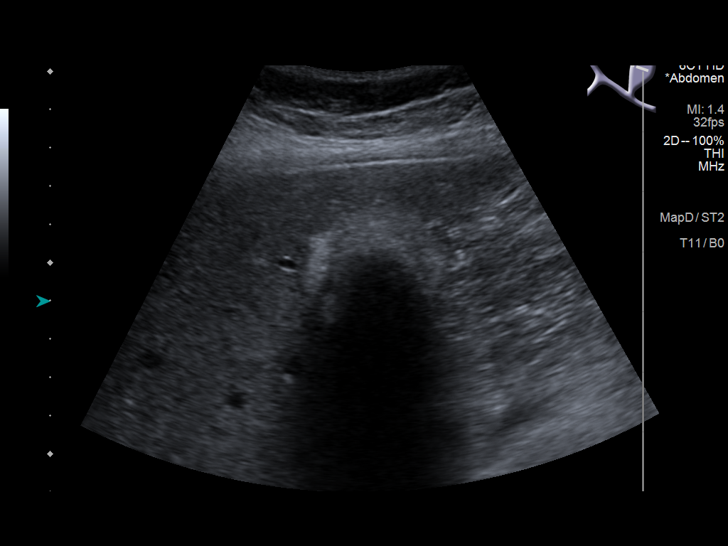
[im 19/57]
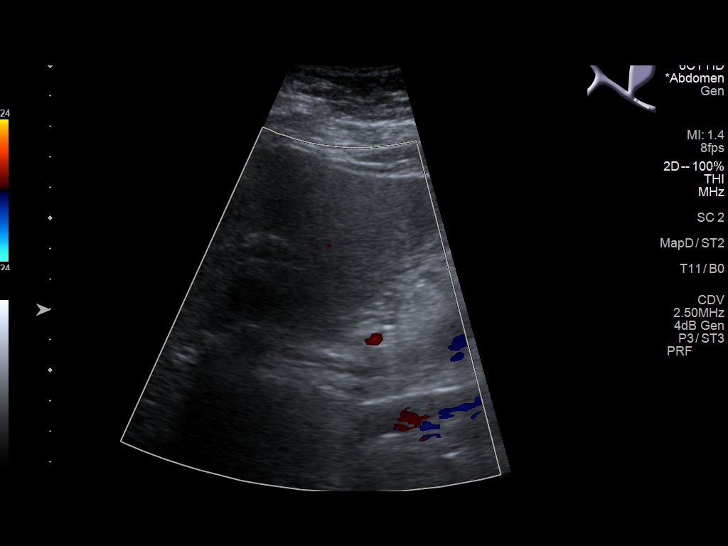
[im 22/57]
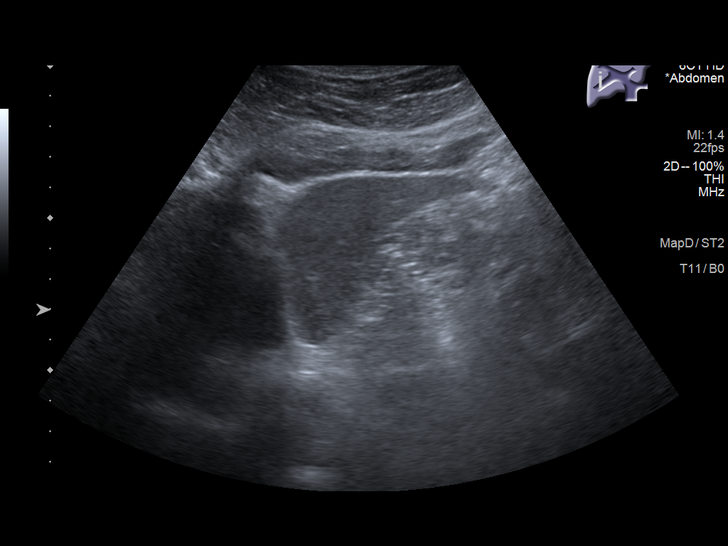
[im 26/57]
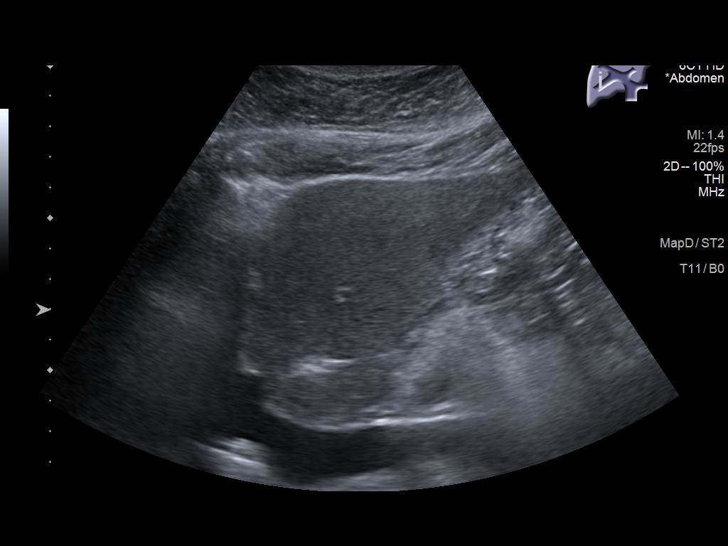
[im 31/57]
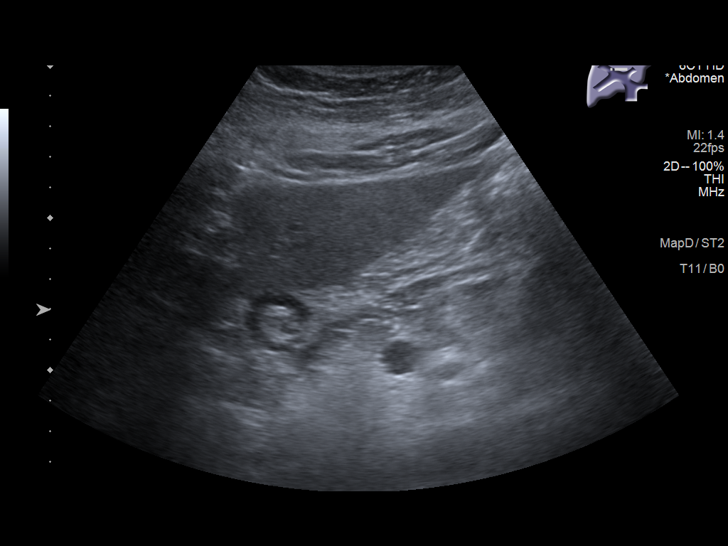
[im 36/57]
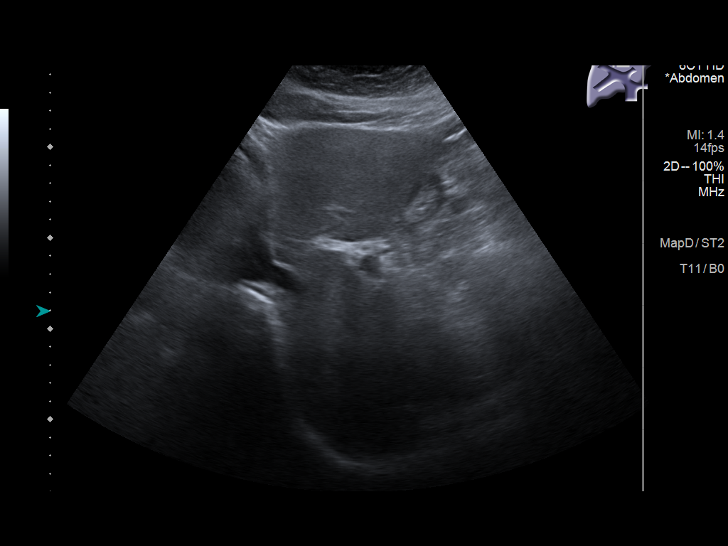
[im 38/57]
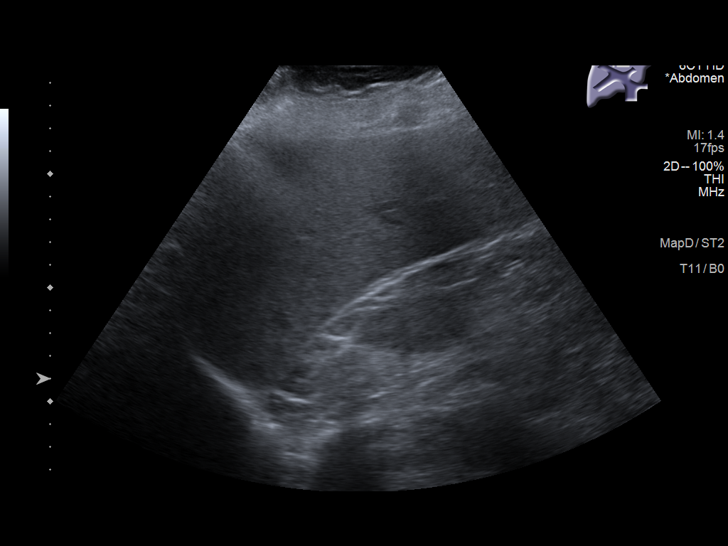
[im 43/57]
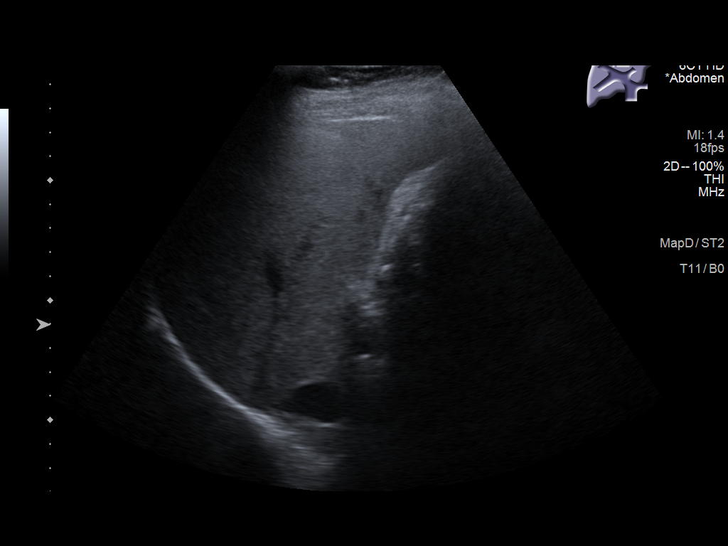
[im 47/57]
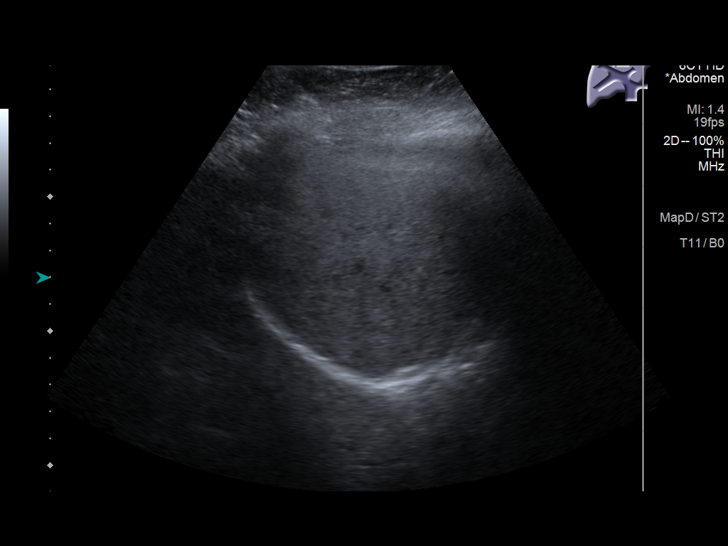
[im 52/57]
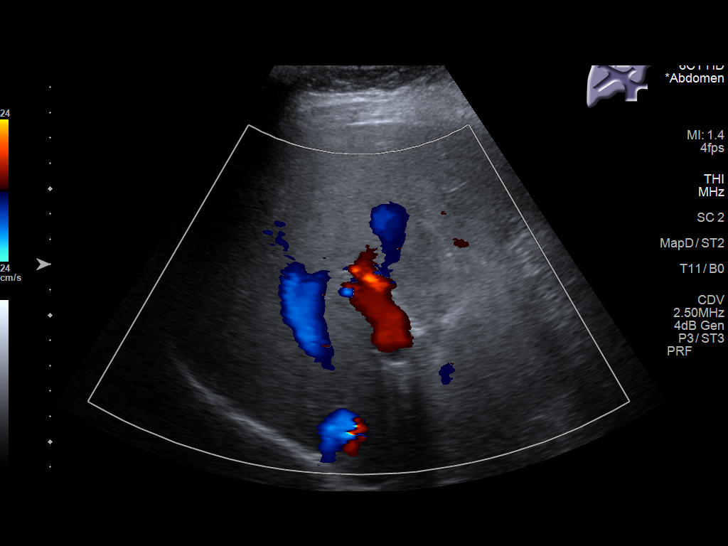
[im 57/57]
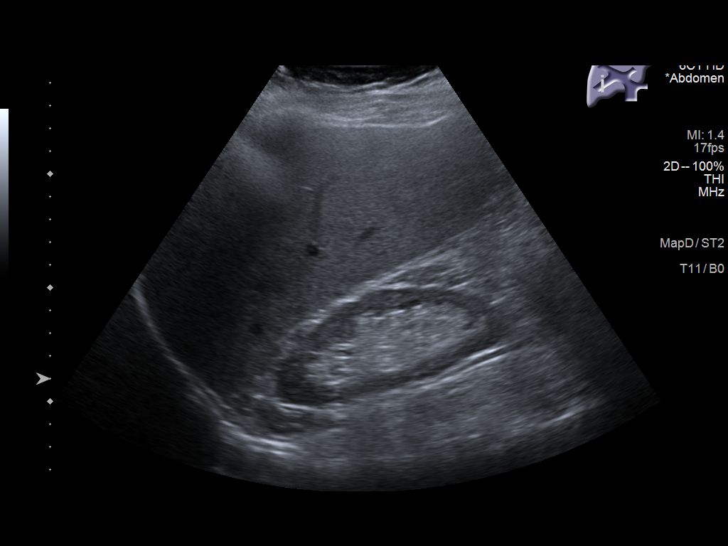

[14 of 25 positions shown; findings below may reference images not displayed]

FINDINGS: Gallbladder:

Diffuse shadowing in the gallbladder presumably due to multiple
stones. Negative sonographic Murphy. Normal wall thickness

Common bile duct:

Diameter: 4.6 mm

Liver:

Liver slightly echogenic. No focal hepatic abnormality. Portal vein
is patent on color Doppler imaging with normal direction of blood
flow towards the liver.

Other: None.
IMPRESSION: 1. Diffuse shadowing at the gallbladder fossa, felt secondary to
stone filled gallbladder. Negative for acute cholecystitis
2. Liver slightly echogenic as may be seen with steatosis

## 2022-06-15 ENCOUNTER — Other Ambulatory Visit: Payer: Self-pay | Admitting: Internal Medicine

## 2022-06-15 ENCOUNTER — Ambulatory Visit: Payer: Managed Care, Other (non HMO) | Admitting: Dermatology

## 2022-06-15 DIAGNOSIS — L814 Other melanin hyperpigmentation: Secondary | ICD-10-CM | POA: Diagnosis not present

## 2022-06-15 DIAGNOSIS — I8393 Asymptomatic varicose veins of bilateral lower extremities: Secondary | ICD-10-CM

## 2022-06-15 DIAGNOSIS — L578 Other skin changes due to chronic exposure to nonionizing radiation: Secondary | ICD-10-CM | POA: Diagnosis not present

## 2022-06-15 DIAGNOSIS — L821 Other seborrheic keratosis: Secondary | ICD-10-CM

## 2022-06-15 DIAGNOSIS — D1800 Hemangioma unspecified site: Secondary | ICD-10-CM

## 2022-06-15 DIAGNOSIS — Z1283 Encounter for screening for malignant neoplasm of skin: Secondary | ICD-10-CM

## 2022-06-15 DIAGNOSIS — L219 Seborrheic dermatitis, unspecified: Secondary | ICD-10-CM

## 2022-06-15 DIAGNOSIS — D229 Melanocytic nevi, unspecified: Secondary | ICD-10-CM

## 2022-06-15 DIAGNOSIS — L720 Epidermal cyst: Secondary | ICD-10-CM

## 2022-06-15 DIAGNOSIS — K219 Gastro-esophageal reflux disease without esophagitis: Secondary | ICD-10-CM

## 2022-06-15 MED ORDER — KETOCONAZOLE 2 % EX SHAM: MEDICATED_SHAMPOO | CUTANEOUS | 6 refills | Status: AC

## 2022-06-15 MED ORDER — CLOBETASOL PROPIONATE 0.05 % EX FOAM: CUTANEOUS | 3 refills | Status: DC

## 2022-06-15 NOTE — Progress Notes (Signed)
Follow-Up Visit   Subjective  Samantha Whitney is a 66 y.o. female who presents for the following: Annual Exam (Patient has noticed two lesions on her left leg that she would like checked today). The patient presents for Total-Body Skin Exam (TBSE) for skin cancer screening and mole check.  The patient has spots, moles and lesions to be evaluated, some may be new or changing.   The following portions of the chart were reviewed this encounter and updated as appropriate:   Tobacco  Allergies  Meds  Problems  Med Hx  Surg Hx  Fam Hx      Review of Systems:  No other skin or systemic complaints except as noted in HPI or Assessment and Plan.  Objective  Well appearing patient in no apparent distress; mood and affect are within normal limits.  A full examination was performed including scalp, head, eyes, ears, nose, lips, neck, chest, axillae, abdomen, back, buttocks, bilateral upper extremities, bilateral lower extremities, hands, feet, fingers, toes, fingernails, and toenails. All findings within normal limits unless otherwise noted below.  Scalp, ears Pink patches with greasy scale.     Assessment & Plan  Seborrheic dermatitis Scalp, ears  Chronic and persistent condition with duration or expected duration over one year. Condition is bothersome/symptomatic for patient. Currently flared.  Seborrheic Dermatitis  -  is a chronic persistent rash characterized by pinkness and scaling most commonly of the mid face but also can occur on the scalp (dandruff), ears; mid chest, mid back and groin.  It tends to be exacerbated by stress and cooler weather.  People who have neurologic disease may experience new onset or exacerbation of existing seborrheic dermatitis.  The condition is not curable but treatable and can be controlled.  Restart Clobetasol 0.05% foam to aa's QD-BID up to two weeks then PRN. Topical steroids (such as triamcinolone, fluocinolone, fluocinonide, mometasone,  clobetasol, halobetasol, betamethasone, hydrocortisone) can cause thinning and lightening of the skin if they are used for too long in the same area. Your physician has selected the right strength medicine for your problem and area affected on the body. Please use your medication only as directed by your physician to prevent side effects.   Restart Ketoconazole 2% shampoo 3d/wk let sit 5-10 minutes before washing out.    ketoconazole (NIZORAL) 2 % shampoo - Scalp, ears Shampoo into the scalp let sit 5-10 minutes then wash out. Use 3 days per week.  Related Medications clobetasol (OLUX) 0.05 % topical foam apply twice daily as needed to affected areas scalp/ears for two weeks then apply twice daily on weekends only, Avoid applying to face, groin, and axilla. Use as directed. Risk of skin atrophy with long-term use reviewed.   Lentigines - Scattered tan macules - Due to sun exposure - Benign-appearing, observe - Recommend daily broad spectrum sunscreen SPF 30+ to sun-exposed areas, reapply every 2 hours as needed. - Call for any changes  Seborrheic Keratoses - Stuck-on, waxy, tan-brown papules and/or plaques  - Benign-appearing - Discussed benign etiology and prognosis. - Observe - Call for any changes  Melanocytic Nevi - Tan-brown and/or pink-flesh-colored symmetric macules and papules - Benign appearing on exam today - Observation - Call clinic for new or changing moles - Recommend daily use of broad spectrum spf 30+ sunscreen to sun-exposed areas.   Hemangiomas - Red papules - Discussed benign nature - Observe - Call for any changes  Actinic Damage - Chronic condition, secondary to cumulative UV/sun exposure - diffuse scaly erythematous macules  with underlying dyspigmentation - Recommend daily broad spectrum sunscreen SPF 30+ to sun-exposed areas, reapply every 2 hours as needed.  - Staying in the shade or wearing long sleeves, sun glasses (UVA+UVB protection) and wide  brim hats (4-inch brim around the entire circumference of the hat) are also recommended for sun protection.  - Call for new or changing lesions.  Varicose Veins/Spider Veins - Dilated blue, purple or red veins at the lower extremities - Reassured - Smaller vessels can be treated by sclerotherapy (a procedure to inject a medicine into the veins to make them disappear) if desired, but the treatment is not covered by insurance. Larger vessels may be covered if symptomatic and we would refer to vascular surgeon if treatment desired.  Milia - tiny firm white papules - type of cyst - benign - may be extracted if symptomatic - observe  Skin cancer screening performed today.  Return in about 1 year (around 06/16/2023) for TBSE.  Luther Redo, CMA, am acting as scribe for Forest Gleason, MD . Documentation: I have reviewed the above documentation for accuracy and completeness, and I agree with the above.  Forest Gleason, MD

## 2022-06-15 NOTE — Telephone Encounter (Signed)
Requested Prescriptions  Pending Prescriptions Disp Refills  . esomeprazole (NEXIUM) 40 MG capsule [Pharmacy Med Name: ESOMEPRAZOLE MAGNESIUM '40MG'$  DR CAPS] 90 capsule 0    Sig: TAKE 1 CAPSULE(40 MG) BY MOUTH DAILY     Gastroenterology: Proton Pump Inhibitors 2 Failed - 06/15/2022  4:45 PM      Failed - ALT in normal range and within 360 days    ALT  Date Value Ref Range Status  02/18/2022 91 (H) 0 - 32 IU/L Final         Passed - AST in normal range and within 360 days    AST  Date Value Ref Range Status  02/18/2022 27 0 - 40 IU/L Final         Passed - Valid encounter within last 12 months    Recent Outpatient Visits          5 months ago Gallstones   [facility] [place], Coralie Keens, NP   8 months ago RUQ pain   [facility] [place], Coralie Keens, NP   1 year ago [place], FNP   1 year ago [place], FNP   1 year ago Diarrhea of infectious origin   [facility], Lupita Raider, [place]

## 2022-06-15 NOTE — Patient Instructions (Addendum)
Recommend taking Heliocare sun protection supplement daily in sunny weather for additional sun protection. For maximum protection on the sunniest days, you can take up to 2 capsules of regular Heliocare OR take 1 capsule of Heliocare Ultra. For prolonged exposure (such as a full day in the sun), you can repeat your dose of the supplement 4 hours after your first dose. Heliocare can be purchased at [facility], at some [facility] or at [email or web address].    Due to recent changes in healthcare laws, you may see results of your pathology and/or laboratory studies on MyChart before the doctors have had a chance to review them. We understand that in some cases there may be results that are confusing or concerning to you. Please understand that not all results are received at the same time and often the doctors may need to interpret multiple results in order to provide you with the best plan of care or course of treatment. Therefore, we ask that you please give us 2 business days to thoroughly review all your results before contacting the office for clarification. Should we see a critical lab result, you will be contacted sooner.   If You Need Anything After Your Visit  If you have any questions or concerns for your doctor, please call our main line at [phone] and press option 4 to reach your doctor's medical assistant. If no one answers, please leave a voicemail as directed and we will return your call as soon as possible. Messages left after 4 pm will be answered the following business day.   You may also send us a message via MyChart. We typically respond to MyChart messages within 1-2 business days.  For prescription refills, please ask your pharmacy to contact our office. Our fax number is [phone].  If you have an urgent issue when the clinic is closed that cannot wait until the next business day, you can page your doctor at the number below.    Please note that while we do our best  to be available for urgent issues outside of office hours, we are not available 24/7.   If you have an urgent issue and are unable to reach us, you may choose to seek medical care at your doctor's office, retail clinic, urgent care center, or emergency room.  If you have a medical emergency, please immediately call 911 or go to the emergency department.  Pager Numbers  - Dr. Kowalski: [phone]  - Dr. Moye: [phone]  - Dr. Stewart: [phone]  In the event of inclement weather, please call our main line at [phone] for an update on the status of any delays or closures.  Dermatology Medication Tips: Please keep the boxes that topical medications come in in order to help keep track of the instructions about where and how to use these. Pharmacies typically print the medication instructions only on the boxes and not directly on the medication tubes.   If your medication is too expensive, please contact our office at [phone] option 4 or send us a message through MyChart.   We are unable to tell what your co-pay for medications will be in advance as this is different depending on your insurance coverage. However, we may be able to find a substitute medication at lower cost or fill out paperwork to get insurance to cover a needed medication.   If a prior authorization is required to get your medication covered by your insurance company, please allow us 1-2 business days to complete this process.    Drug prices often vary depending on where the prescription is filled and some pharmacies may offer cheaper prices.  The website www.goodrx.com contains coupons for medications through different pharmacies. The prices here do not account for what the cost may be with help from insurance (it may be cheaper with your insurance), but the website can give you the price if you did not use any insurance.  - You can print the associated coupon and take it with your prescription to the  pharmacy.  - You may also stop by our office during regular business hours and pick up a GoodRx coupon card.  - If you need your prescription sent electronically to a different pharmacy, notify our office through [place] MyChart or by phone at [phone] option 4.     Si Usted Necesita Algo Despus de Su Visita  Tambin puede enviarnos un mensaje a travs de MyChart. Por lo general respondemos a los mensajes de MyChart en el transcurso de 1 a 2 das hbiles.  Para renovar recetas, por favor pida a su farmacia que se ponga en contacto con nuestra oficina. Nuestro nmero de fax es el [phone].  Si tiene un asunto urgente cuando la clnica est cerrada y que no puede esperar hasta el siguiente da hbil, puede llamar/localizar a su doctor(a) al nmero que aparece a continuacin.   Por favor, tenga en cuenta que aunque hacemos todo lo posible para estar disponibles para asuntos urgentes fuera del horario de oficina, no estamos disponibles las 24 horas del da, los 7 das de la semana.   Si tiene un problema urgente y no puede comunicarse con nosotros, puede optar por buscar atencin mdica  en el consultorio de su doctor(a), en una clnica privada, en un centro de atencin urgente o en una sala de emergencias.  Si tiene una emergencia mdica, por favor llame inmediatamente al 911 o vaya a la sala de emergencias.  Nmeros de bper  - Dr. Kowalski: [phone]  - Dra. Moye: [phone]  - Dra. Stewart: [phone]  En caso de inclemencias del tiempo, por favor llame a nuestra lnea principal al [phone] para una actualizacin sobre el estado de cualquier retraso o cierre.  Consejos para la medicacin en dermatologa: Por favor, guarde las cajas en las que vienen los medicamentos de uso tpico para ayudarle a seguir las instrucciones sobre dnde y cmo usarlos. Las farmacias generalmente imprimen las instrucciones del medicamento slo en las cajas y no directamente en los  tubos del medicamento.   Si su medicamento es muy caro, por favor, pngase en contacto con nuestra oficina llamando al [phone] y presione la opcin 4 o envenos un mensaje a travs de MyChart.   No podemos decirle cul ser su copago por los medicamentos por adelantado ya que esto es diferente dependiendo de la cobertura de su seguro. Sin embargo, es posible que podamos encontrar un medicamento sustituto a menor costo o llenar un formulario para que el seguro cubra el medicamento que se considera necesario.   Si se requiere una autorizacin previa para que su compaa de seguros cubra su medicamento, por favor permtanos de 1 a 2 das hbiles para completar este proceso.  Los precios de los medicamentos varan con frecuencia dependiendo del lugar de dnde se surte la receta y alguna farmacias pueden ofrecer precios ms baratos.  El sitio web www.goodrx.com tiene cupones para medicamentos de diferentes farmacias. Los precios aqu no tienen en cuenta lo que podra costar con la ayuda del seguro (puede ser ms   barato con su seguro), pero el sitio web puede darle el precio si no Field seismologist.  - Puede imprimir el cupn correspondiente y llevarlo con su receta a la farmacia.  - Tambin puede pasar por nuestra oficina durante el horario de atencin regular y Charity fundraiser una tarjeta de cupones de GoodRx.  - Si necesita que su receta se enve electrnicamente a una farmacia diferente, informe a nuestra oficina a travs de MyChart de [place] o por telfono llamando al [phone] y presione la opcin 4.   BEFORE YOUR APPOINTMENT FOR SCLEROTHERAPY  1. When you telephone for your appointment for the sclerotherapy procedure, please let the receptionist know that you are scheduling for the fifteen (15) minute sclerotherapy procedure not just a regular visit.  2. On the day of the procedure, please cleanse and dry the areas, but do not use any moisturizers or other products on the area(s) to be  treated.  3. Bring a pair of comfortable shorts to wear during the procedure.  4. Be sure to bring your recommended graduated compression stockings with you to the office. You will be wearing them home when your visit is over. These compression hose can be purchased at most medical supply stores.  After Your Sclerotherapy Procedure  1. Please wear the graduated compression stockings for 24 hours immediately following the completion of the sclerotherapy procedure.  2. We recommend that you avoid vigorous activity as much as possible for the first twenty-four (24) hours. You can do your "normal" routine, but avoid an above normal amount of time on your feet. Elevating the legs when sitting and avoidance of vigorous leg movements or exercise in the first few days after treatment may improve your results.  3. You may remove the compression dressings (cotton balls) and tape the next morning.  4. Please continue wearing the compression stockings during waking hours for the two (2) weeks following sclerotherapy.  5. If you have any blisters, sores or ulcers or other problems following your procedure please call or return to the office immediately.     THE PROCEDURE FEE IS $350.00 PER FIFTEEN (15) MINUTE SESSION. WE REQUIRE THAT THIS PROCEDURE BE PAID FOR IN FULL ON OR BEFORE THE DATE THAT IT IS PERFORMED. WE WILL GIVE YOU A RECEIPT THAT YOU CAN FILE WITH YOUR INSURANCE COMPANY. WE GENERALLY DO NOT FILE THIS PROCEDURE WITH ANY INSURANCE COMPANY EXCEPT UNDER CERTAIN CIRCUMSTANCES WHERE PRIOR AUTHORIZATION HAS BEEN CONFIRMED. THIS PROCEDURE IS GENERALLY CONSIDERED TO BE A COSMETIC PROCEDURE BY INSURANCE COMPANIES.

## 2022-06-21 ENCOUNTER — Encounter: Payer: Self-pay | Admitting: Dermatology

## 2022-06-24 ENCOUNTER — Ambulatory Visit: Payer: Self-pay | Admitting: *Deleted

## 2022-06-24 NOTE — Telephone Encounter (Signed)
  Chief Complaint: Frequent urination and discomfort over bladder area lower abd.  Mild discomfort into lower back. Symptoms: For past 3-4 days discomfort with urination and pressure over bladder area Frequency: past 3-4 days when urinates Pertinent Negatives: Patient denies burning, fever, blood in urine Disposition: '[]'$ ED /'[]'$ Urgent Care (no appt availability in office) / '[x]'$ Appointment(In office/virtual)/ '[]'$  [place] [facility]/ '[]'$ Home Care/ '[]'$ Refused Recommended Disposition /'[]'$ [place] [facility]/ '[]'$  Follow-up with PCP Additional Notes: Appt. Made with Dr. Parks Ranger for 06/28/2022.  Pt is an Armed forces operational officer.   She can have her urine sample done for free.  Would Dr. Parks Ranger or Webb Silversmith be willing to send in a order for the urine sample to [facility] and order antibiotic based on the results?   She can be reached at [phone].

## 2022-06-24 NOTE — Telephone Encounter (Signed)
Reason for Disposition  Age > 60 years  Answer Assessment - Initial Assessment Questions 1. LOCATION: "Where does it hurt?"      It's a discomfort for a few days.  Not constipated. It's lower abd discomfort over my bladder area.    When urinating more than usual.   No burning or discharge.   Maybe a UTI.   I usually get the burning.   2. RADIATION: "Does the pain shoot anywhere else?" (e.g., chest, back)     A little discomfort in back. 3. ONSET: "When did the pain begin?" (e.g., minutes, hours or days ago)      Early this week for 3-4 days. 4. SUDDEN: "Gradual or sudden onset?"     N/A 5. PATTERN "Does the pain come and go, or is it constant?"    - If constant: "Is it getting better, staying the same, or worsening?"      (Note: Constant means the pain never goes away completely; most serious pain is constant and it progresses)     - If intermittent: "How long does it last?" "Do you have pain now?"     (Note: Intermittent means the pain goes away completely between bouts)     When I urinate 6. SEVERITY: "How bad is the pain?"  (e.g., Scale 1-10; mild, moderate, or severe)   - MILD (1-3): doesn't interfere with normal activities, abdomen soft and not tender to touch    - MODERATE (4-7): interferes with normal activities or awakens from sleep, abdomen tender to touch    - SEVERE (8-10): excruciating pain, doubled over, unable to do any normal activities      N/A 7. RECURRENT SYMPTOM: "Have you ever had this type of stomach pain before?" If Yes, ask: "When was the last time?" and "What happened that time?"      Not for a really long time. 8. CAUSE: "What do you think is causing the stomach pain?"     UTI 9. RELIEVING/AGGRAVATING FACTORS: "What makes it better or worse?" (e.g., movement, antacids, bowel movement)     Urinating 10. OTHER SYMPTOMS: "Do you have any other symptoms?" (e.g., back pain, diarrhea, fever, urination pain, vomiting)       No 11. PREGNANCY: "Is there any chance you  are pregnant?" "When was your last menstrual period?"       N/A  Protocols used: Abdominal Pain - [facility]

## 2022-06-28 ENCOUNTER — Ambulatory Visit: Payer: Managed Care, Other (non HMO) | Admitting: Family Medicine

## 2022-07-21 ENCOUNTER — Encounter: Payer: Self-pay | Admitting: Internal Medicine

## 2022-07-21 ENCOUNTER — Ambulatory Visit
Admission: RE | Admit: 2022-07-21 | Discharge: 2022-07-21 | Disposition: A | Discharge status: Home / Self Care | Payer: Managed Care, Other (non HMO) | Source: Ambulatory Visit | Attending: Internal Medicine | Admitting: Internal Medicine

## 2022-07-21 ENCOUNTER — Ambulatory Visit (INDEPENDENT_AMBULATORY_CARE_PROVIDER_SITE_OTHER): Payer: Managed Care, Other (non HMO) | Admitting: Internal Medicine

## 2022-07-21 ENCOUNTER — Ambulatory Visit
Admission: RE | Admit: 2022-07-21 | Discharge: 2022-07-21 | Disposition: A | Discharge status: Home / Self Care | Payer: Managed Care, Other (non HMO) | Source: Home / Self Care | Attending: Internal Medicine | Admitting: Internal Medicine

## 2022-07-21 VITALS — BP 126/84 | HR 81 | Temp 97.3°F | Ht 64.0 in | Wt 203.0 lb

## 2022-07-21 DIAGNOSIS — H9192 Unspecified hearing loss, left ear: Secondary | ICD-10-CM

## 2022-07-21 DIAGNOSIS — G4700 Insomnia, unspecified: Secondary | ICD-10-CM | POA: Insufficient documentation

## 2022-07-21 DIAGNOSIS — Z6834 Body mass index (BMI) 34.0-34.9, adult: Secondary | ICD-10-CM

## 2022-07-21 DIAGNOSIS — M25552 Pain in left hip: Secondary | ICD-10-CM | POA: Insufficient documentation

## 2022-07-21 DIAGNOSIS — N760 Acute vaginitis: Secondary | ICD-10-CM

## 2022-07-21 DIAGNOSIS — Z0001 Encounter for general adult medical examination with abnormal findings: Secondary | ICD-10-CM

## 2022-07-21 DIAGNOSIS — E6609 Other obesity due to excess calories: Secondary | ICD-10-CM

## 2022-07-21 DIAGNOSIS — F5101 Primary insomnia: Secondary | ICD-10-CM | POA: Diagnosis not present

## 2022-07-21 DIAGNOSIS — Z1231 Encounter for screening mammogram for malignant neoplasm of breast: Secondary | ICD-10-CM | POA: Diagnosis not present

## 2022-07-21 MED ORDER — HYDROXYZINE HCL 10 MG PO TABS: 10.0000 mg | ORAL_TABLET | Freq: Every evening | ORAL | 2 refills | Status: DC | PRN

## 2022-07-21 NOTE — Assessment & Plan Note (Signed)
We will trial hydroxyzine 10 mg nightly as needed

## 2022-07-21 NOTE — Patient Instructions (Signed)
Health Maintenance for Postmenopausal Women Menopause is a normal process in which your ability to get pregnant comes to an end. This process happens slowly over many months or years, usually between the ages of 48 and 55. Menopause is complete when you have missed your menstrual period for 12 months. It is important to talk with your health care provider about some of the most common conditions that affect women after menopause (postmenopausal women). These include heart disease, cancer, and bone loss (osteoporosis). Adopting a healthy lifestyle and getting preventive care can help to promote your health and wellness. The actions you take can also lower your chances of developing some of these common conditions. What are the signs and symptoms of menopause? During menopause, you may have the following symptoms: Hot flashes. These can be moderate or severe. Night sweats. Decrease in sex drive. Mood swings. Headaches. Tiredness (fatigue). Irritability. Memory problems. Problems falling asleep or staying asleep. Talk with your health care provider about treatment options for your symptoms. Do I need hormone replacement therapy? Hormone replacement therapy is effective in treating symptoms that are caused by menopause, such as hot flashes and night sweats. Hormone replacement carries certain risks, especially as you become older. If you are thinking about using estrogen or estrogen with progestin, discuss the benefits and risks with your health care provider. How can I reduce my risk for heart disease and stroke? The risk of heart disease, heart attack, and stroke increases as you age. One of the causes may be a change in the body's hormones during menopause. This can affect how your body uses dietary fats, triglycerides, and cholesterol. Heart attack and stroke are medical emergencies. There are many things that you can do to help prevent heart disease and stroke. Watch your blood pressure High  blood pressure causes heart disease and increases the risk of stroke. This is more likely to develop in people who have high blood pressure readings or are overweight. Have your blood pressure checked: Every 3-5 years if you are 18-39 years of age. Every year if you are 40 years old or older. Eat a healthy diet  Eat a diet that includes plenty of vegetables, fruits, low-fat dairy products, and lean protein. Do not eat a lot of foods that are high in solid fats, added sugars, or sodium. Get regular exercise Get regular exercise. This is one of the most important things you can do for your health. Most adults should: Try to exercise for at least 150 minutes each week. The exercise should increase your heart rate and make you sweat (moderate-intensity exercise). Try to do strengthening exercises at least twice each week. Do these in addition to the moderate-intensity exercise. Spend less time sitting. Even light physical activity can be beneficial. Other tips Work with your health care provider to achieve or maintain a healthy weight. Do not use any products that contain nicotine or tobacco. These products include cigarettes, chewing tobacco, and vaping devices, such as e-cigarettes. If you need help quitting, ask your health care provider. Know your numbers. Ask your health care provider to check your cholesterol and your blood sugar (glucose). Continue to have your blood tested as directed by your health care provider. Do I need screening for cancer? Depending on your health history and family history, you may need to have cancer screenings at different stages of your life. This may include screening for: Breast cancer. Cervical cancer. Lung cancer. Colorectal cancer. What is my risk for osteoporosis? After menopause, you may be   at increased risk for osteoporosis. Osteoporosis is a condition in which bone destruction happens more quickly than new bone creation. To help prevent osteoporosis or  the bone fractures that can happen because of osteoporosis, you may take the following actions: If you are 19-50 years old, get at least 1,000 mg of calcium and at least 600 international units (IU) of vitamin D per day. If you are older than age 50 but younger than age 70, get at least 1,200 mg of calcium and at least 600 international units (IU) of vitamin D per day. If you are older than age 70, get at least 1,200 mg of calcium and at least 800 international units (IU) of vitamin D per day. Smoking and drinking excessive alcohol increase the risk of osteoporosis. Eat foods that are rich in calcium and vitamin D, and do weight-bearing exercises several times each week as directed by your health care provider. How does menopause affect my mental health? Depression may occur at any age, but it is more common as you become older. Common symptoms of depression include: Feeling depressed. Changes in sleep patterns. Changes in appetite or eating patterns. Feeling an overall lack of motivation or enjoyment of activities that you previously enjoyed. Frequent crying spells. Talk with your health care provider if you think that you are experiencing any of these symptoms. General instructions See your health care provider for regular wellness exams and vaccines. This may include: Scheduling regular health, dental, and eye exams. Getting and maintaining your vaccines. These include: Influenza vaccine. Get this vaccine each year before the flu season begins. Pneumonia vaccine. Shingles vaccine. Tetanus, diphtheria, and pertussis (Tdap) booster vaccine. Your health care provider may also recommend other immunizations. Tell your health care provider if you have ever been abused or do not feel safe at home. Summary Menopause is a normal process in which your ability to get pregnant comes to an end. This condition causes hot flashes, night sweats, decreased interest in sex, mood swings, headaches, or lack  of sleep. Treatment for this condition may include hormone replacement therapy. Take actions to keep yourself healthy, including exercising regularly, eating a healthy diet, watching your weight, and checking your blood pressure and blood sugar levels. Get screened for cancer and depression. Make sure that you are up to date with all your vaccines. This information is not intended to replace advice given to you by your health care provider. Make sure you discuss any questions you have with your health care provider. Document Revised: 04/26/2021 Document Reviewed: 04/26/2021 Elsevier Patient Education  2023 Elsevier Inc.  

## 2022-07-21 NOTE — Progress Notes (Signed)
Subjective:    Patient ID: Samantha Whitney, female    DOB: 1956-12-05, 66 y.o.   MRN: [id number]  HPI  Patient presents to clinic today for her annual exam.  Flu: 09/2021 Tetanus: 02/2019 COVID: X3 Pneumovax: Never Prevnar: Never Shingrix: 02/2019, 10/2019 Pap smear: 02/2019 Mammogram: 2018 Bone density: > 5 years ago Colon screening: 01/31/2022 Vision screening: as needed Dentist: biannually  Diet: She does eat meat. She consumes fruits and veggies. She tries to avoid fried foods. She drinks mostly water, tea, coffee. Exercise: Walking  Review of Systems  Past Medical History:  Diagnosis Date   Allergy    Bronchitis    GERD (gastroesophageal reflux disease)     Current Outpatient Medications  Medication Sig Dispense Refill   albuterol (VENTOLIN HFA) 108 (90 Base) MCG/ACT inhaler Inhale 1-2 puffs into the lungs every 6 (six) hours as needed for shortness of breath. (Patient not taking: Reported on 06/15/2022) 18 g 1   Bacillus Coagulans-Inulin (PROBIOTIC-PREBIOTIC PO) Take 1 capsule by mouth daily. (Patient not taking: Reported on 06/15/2022)     Calcium Carbonate-Vitamin D (CALTRATE 600+D PO) Take 1 tablet by mouth daily. (Patient not taking: Reported on 06/15/2022)     clobetasol (OLUX) 0.05 % topical foam apply twice daily as needed to affected areas scalp/ears for two weeks then apply twice daily on weekends only, Avoid applying to face, groin, and axilla. Use as directed. Risk of skin atrophy with long-term use reviewed. 50 g 3   conjugated estrogens (PREMARIN) vaginal cream Use a fingertip amount at opening of vagina nightly for vaginal dryness. (Patient not taking: Reported on 06/15/2022) 42.5 g 12   esomeprazole (NEXIUM) 40 MG capsule TAKE 1 CAPSULE(40 MG) BY MOUTH DAILY 90 capsule 0   ketoconazole (NIZORAL) 2 % shampoo Shampoo into the scalp let sit 5-10 minutes then wash out. Use 3 days per week. 120 mL 6   loratadine (CLARITIN) 10 MG tablet Take 10 mg by mouth daily.      Multiple Vitamin (MULTIVITAMIN WITH MINERALS) TABS tablet Take 1 tablet by mouth 4 (four) times a week. (Patient not taking: Reported on 06/15/2022)     Naproxen Sod-diphenhydrAMINE (ALEVE PM) 220-25 MG TABS Take 1 tablet by mouth at bedtime as needed (sleep). (Patient not taking: Reported on 06/15/2022)     Tiotropium Bromide Monohydrate (SPIRIVA RESPIMAT) 1.25 MCG/ACT AERS Inhale 2 puffs into the lungs daily. (Patient not taking: Reported on 06/15/2022) 4 g 2   VITAMIN E PO Take 1 capsule by mouth daily. (Patient not taking: Reported on 06/15/2022)     No current facility-administered medications for this visit.    Allergies  Allergen Reactions   Penicillins Hives   Scallops [Shellfish Allergy] Nausea And Vomiting and Rash    Family History  Problem Relation Age of Onset   Cancer Mother        ColoRectal   Cancer Father        Stomach Cancer    Social History   Socioeconomic History   Marital status: Married    Spouse name: Everlean Patterson   Number of children: 1   Years of education: Not on file   Highest education level: Bachelor's degree (e.g., BA, AB, BS)  Occupational History    Employer: [employer]  Tobacco Use   Smoking status: Never   Smokeless tobacco: Never  Vaping Use   Vaping Use: Never used  Substance and Sexual Activity   Alcohol use: Yes    Alcohol/week: 2.0 standard drinks of alcohol  Types: 2 Glasses of wine per week    Comment: weekly   Drug use: Never   Sexual activity: Not on file  Other Topics Concern   Not on file  Social History Narrative   Not on file   Social Determinants of Health   Financial Resource Strain: Not on file  Food Insecurity: Not on file  Transportation Needs: Not on file  Physical Activity: Not on file  Stress: Not on file  Social Connections: Not on file  Intimate Partner Violence: Not on file     Constitutional: Patient reports difficulty losing weight.  Denies fever, malaise, fatigue, headache or abrupt weight changes.   HEENT: Patient reports decreased hearing in her left ear.  Denies eye pain, eye redness, ear pain, ringing in the ears, wax buildup, runny nose, nasal congestion, bloody nose, or sore throat. Respiratory: Denies difficulty breathing, shortness of breath, cough or sputum production.   Cardiovascular: Denies chest pain, chest tightness, palpitations or swelling in the hands or feet.  Gastrointestinal: Denies abdominal pain, bloating, constipation, diarrhea or blood in the stool.  GU: Denies urgency, frequency, pain with urination, burning sensation, blood in urine, odor or discharge. Musculoskeletal: Pt reports left hip pain, nocturnal muscle cramps. Denies decrease in range of motion, difficulty with gait, muscle pain or joint swelling.  Skin: Denies redness, rashes, lesions or ulcercations.  Neurological: Pt reports difficulty falling asleep, restless legs. Denies dizziness, difficulty with memory, difficulty with speech or problems with balance and coordination.  Psych: Denies anxiety, depression, SI/HI.  No other specific complaints in a complete review of systems (except as listed in HPI above).     Objective:   Physical Exam  BP 126/84 (BP Location: Left Arm, Patient Position: Sitting, Cuff Size: Normal)   Pulse 81   Temp (!) 97.3 F (36.3 C) (Temporal)   Ht _0  (1.626 m)   Wt 203 lb (92.1 kg)   SpO2 95%   BMI 34.84 kg/m   Wt Readings from Last 3 Encounters:  03/10/22 200 lb 9.6 oz (91 kg)  02/25/22 199 lb (90.3 kg)  02/10/22 199 lb (90.3 kg)    General: Appears her stated age, obese, in NAD. Skin: Warm, dry and intact.  HEENT: Head: normal shape and size; Eyes: sclera white, no icterus, conjunctiva pink, PERRLA and EOMs intact; Neck:  Neck supple, trachea midline. No masses, lumps or thyromegaly present.  Cardiovascular: Normal rate and rhythm. S1,S2 noted.  No murmur, rubs or gallops noted. No JVD or BLE edema. No carotid bruits noted. Pulmonary/Chest: Normal effort and  positive vesicular breath sounds. No respiratory distress. No wheezes, rales or ronchi noted.  Abdomen: Normal bowel sounds.  Musculoskeletal: Normal abduction, adduction and external rotation of the left hip. Decreased internal rotation of the left shoulder. Pain with palpation of the left trochanter. Strength 5/5 BUE/BLE. No difficulty with gait.  Neurological: Alert and oriented. Cranial nerves II-XII grossly intact. Coordination normal.  Psychiatric: Mood and affect normal. Behavior is normal. Judgment and thought content normal.     BMET    Component Value Date/Time   NA 136 02/18/2022 0939   K 4.7 02/18/2022 0939   CL 100 02/18/2022 0939   CO2 23 02/18/2022 0939   GLUCOSE 97 02/18/2022 0939   BUN 15 02/18/2022 0939   CREATININE 0.78 02/18/2022 0939   CALCIUM 9.9 02/18/2022 0939   GFRNONAA 79 07/08/2020 0916   GFRAA 91 07/08/2020 0916    Lipid Panel     Component Value Date/Time  CHOL 224 (H) 10/27/2021 0839   TRIG 138 10/27/2021 0839   HDL 77 10/27/2021 0839   CHOLHDL 2.9 10/27/2021 0839   LDLCALC 123 (H) 10/27/2021 0839    CBC    Component Value Date/Time   WBC 6.1 02/18/2022 0939   RBC 4.53 02/18/2022 0939   HGB 13.7 02/18/2022 0939   HCT 39.6 02/18/2022 0939   PLT 295 02/18/2022 0939   MCV 87 02/18/2022 0939   MCH 30.2 02/18/2022 0939   MCHC 34.6 02/18/2022 0939   RDW 12.5 02/18/2022 0939   LYMPHSABS 1.9 02/18/2022 0939   EOSABS 0.2 02/18/2022 0939   BASOSABS 0.1 02/18/2022 0939    Hgb A1C Lab Results  Component Value Date   HGBA1C 5.7 (H) 10/27/2021            Assessment & Plan:   Preventative Health Maintenance:  Encouraged her to get a flu shot in the fall Tetanus UTD Encouraged her to get her COVID booster She thinks she had a pneumonia vaccine at [facility] so we will hold off on Prevnar 20 today Shingrix UTD She no longer needs to screen for cervical cancer Mammogram and bone density ordered-she will call to schedule Colon screening  UTD Encouraged her to consume a balanced diet and exercise regimen Advised her to see an eye doctor and dentist annually We will check CBC, c-Met, lipid, A1c today  High-Frequency Hearing Loss, Left Ear:  Audiometry today Referral to audiology for further evaluation  Left Hip Pain:  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

## 2022-07-21 NOTE — Assessment & Plan Note (Signed)
Discussed use of phentermine, Wegovy or Saxenda Encourage 1400-calorie restricted diet that consist mainly of lean proteins and green vegetables

## 2022-07-28 MED ORDER — MELOXICAM 15 MG PO TABS: 15.0000 mg | ORAL_TABLET | Freq: Every day | ORAL | 0 refills | Status: DC

## 2022-07-28 NOTE — Addendum Note (Signed)
Addended by: Jearld Fenton on: 07/28/2022 12:00 PM   Modules accepted: Orders

## 2022-08-18 ENCOUNTER — Ambulatory Visit
Admission: RE | Admit: 2022-08-18 | Discharge: 2022-08-18 | Disposition: A | Discharge status: Home / Self Care | Payer: Managed Care, Other (non HMO) | Source: Ambulatory Visit | Attending: Internal Medicine | Admitting: Internal Medicine

## 2022-08-18 DIAGNOSIS — Z1231 Encounter for screening mammogram for malignant neoplasm of breast: Secondary | ICD-10-CM | POA: Insufficient documentation

## 2022-08-18 DIAGNOSIS — N760 Acute vaginitis: Secondary | ICD-10-CM | POA: Diagnosis present

## 2022-08-20 LAB — LIPID PANEL
Chol/HDL Ratio: 2.6 ratio (ref 0.0–4.4)
Cholesterol, Total: 196 mg/dL (ref 100–199)
HDL: 74 mg/dL (ref >39)
LDL Chol Calc (NIH): 103 mg/dL — ABNORMAL HIGH (ref 0–99)
Triglycerides: 106 mg/dL (ref 0–149)
VLDL Cholesterol Cal: 19 mg/dL (ref 5–40)

## 2022-08-20 LAB — COMPREHENSIVE METABOLIC PANEL
ALT: 18 IU/L (ref 0–32)
AST: 23 IU/L (ref 0–40)
Albumin/Globulin Ratio: 1.6 (ref 1.2–2.2)
Albumin: 4.3 g/dL (ref 3.9–4.9)
Alkaline Phosphatase: 58 IU/L (ref 44–121)
BUN/Creatinine Ratio: 17 (ref 12–28)
BUN: 14 mg/dL (ref 8–27)
Bilirubin Total: 0.2 mg/dL (ref 0.0–1.2)
CO2: 23 mmol/L (ref 20–29)
Calcium: 9.4 mg/dL (ref 8.7–10.3)
Chloride: 105 mmol/L (ref 96–106)
Creatinine, Ser: 0.84 mg/dL (ref 0.57–1.00)
Globulin, Total: 2.7 g/dL (ref 1.5–4.5)
Glucose: 93 mg/dL (ref 70–99)
Potassium: 4.8 mmol/L (ref 3.5–5.2)
Sodium: 142 mmol/L (ref 134–144)
Total Protein: 7 g/dL (ref 6.0–8.5)
eGFR: 76 mL/min/{1.73_m2} (ref >59)

## 2022-08-20 LAB — HEMOGLOBIN A1C
Est. average glucose Bld gHb Est-mCnc: 123 mg/dL
Hgb A1c MFr Bld: 5.9 % — ABNORMAL HIGH (ref 4.8–5.6)

## 2022-08-20 LAB — CBC
Hematocrit: 36.6 % (ref 34.0–46.6)
Hemoglobin: 12.4 g/dL (ref 11.1–15.9)
MCH: 30 pg (ref 26.6–33.0)
MCHC: 33.9 g/dL (ref 31.5–35.7)
MCV: 89 fL (ref 79–97)
Platelets: 250 10*3/uL (ref 150–450)
RBC: 4.13 x10E6/uL (ref 3.77–5.28)
RDW: 13.2 % (ref 11.7–15.4)
WBC: 5.2 10*3/uL (ref 3.4–10.8)

## 2022-08-26 ENCOUNTER — Other Ambulatory Visit: Payer: Self-pay | Admitting: Internal Medicine

## 2022-08-26 DIAGNOSIS — M25552 Pain in left hip: Secondary | ICD-10-CM

## 2022-08-29 NOTE — Telephone Encounter (Signed)
Requested Prescriptions  Pending Prescriptions Disp Refills  . meloxicam (MOBIC) 15 MG tablet [Pharmacy Med Name: MELOXICAM 15MG TABLETS] 30 tablet 0    Sig: TAKE 1 TABLET(15 MG) BY MOUTH DAILY     Analgesics:  COX2 Inhibitors Failed - 08/26/2022  3:14 AM      Failed - Manual Review: Labs are only required if the patient has taken medication for more than 8 weeks.      Passed - HGB in normal range and within 360 days    Hemoglobin  Date Value Ref Range Status  08/19/2022 12.4 11.1 - 15.9 g/dL Final         Passed - Cr in normal range and within 360 days    Creatinine, Ser  Date Value Ref Range Status  08/19/2022 0.84 0.57 - 1.00 mg/dL Final         Passed - HCT in normal range and within 360 days    Hematocrit  Date Value Ref Range Status  08/19/2022 36.6 34.0 - 46.6 % Final         Passed - AST in normal range and within 360 days    AST  Date Value Ref Range Status  08/19/2022 23 0 - 40 IU/L Final         Passed - ALT in normal range and within 360 days    ALT  Date Value Ref Range Status  08/19/2022 18 0 - 32 IU/L Final         Passed - eGFR is 30 or above and within 360 days    GFR calc Af Amer  Date Value Ref Range Status  07/08/2020 91 >59 mL/min/1.73 Final    Comment:    **[facility] currently reports eGFR in compliance with the current**   recommendations of the [employer]. [facility] will   update reporting as new guidelines are published from the NKF-ASN   Task force.    GFR calc non Af Amer  Date Value Ref Range Status  07/08/2020 79 >59 mL/min/1.73 Final   eGFR  Date Value Ref Range Status  08/19/2022 76 >59 mL/min/1.73 Final         Passed - Patient is not pregnant      Passed - Valid encounter within last 12 months    Recent Outpatient Visits          1 month ago Encounter for general adult medical examination with abnormal findings   [facility], Coralie Keens, NP   8 months ago [place], Coralie Keens, NP   10 months ago RUQ pain   [facility] [address], Coralie Keens, NP   1 year ago [facility] [place], Lupita Raider, FNP   1 year ago [facility] [place], Lupita Raider, [place]

## 2022-09-01 ENCOUNTER — Inpatient Hospital Stay
Admission: RE | Admit: 2022-09-01 | Discharge: 2022-09-01 | Disposition: A | Discharge status: Home / Self Care | Payer: Self-pay | Source: Ambulatory Visit | Attending: *Deleted | Admitting: *Deleted

## 2022-09-01 ENCOUNTER — Other Ambulatory Visit: Payer: Self-pay | Admitting: *Deleted

## 2022-09-01 DIAGNOSIS — Z1231 Encounter for screening mammogram for malignant neoplasm of breast: Secondary | ICD-10-CM

## 2022-09-02 ENCOUNTER — Other Ambulatory Visit: Payer: Self-pay | Admitting: Internal Medicine

## 2022-09-02 DIAGNOSIS — R928 Other abnormal and inconclusive findings on diagnostic imaging of breast: Secondary | ICD-10-CM

## 2022-09-12 ENCOUNTER — Telehealth: Payer: Self-pay | Admitting: *Deleted

## 2022-09-12 ENCOUNTER — Other Ambulatory Visit: Payer: Self-pay | Admitting: Internal Medicine

## 2022-09-12 DIAGNOSIS — K219 Gastro-esophageal reflux disease without esophagitis: Secondary | ICD-10-CM

## 2022-09-12 NOTE — Telephone Encounter (Signed)
Patient is calling concerned about her MM results and need for further views. Advised patient per report and she is calmer regarding needed views.Apologized for the office not reaching out- and not sure of provider got report. Will make sure she is aware to look for repeat views.

## 2022-09-12 NOTE — Telephone Encounter (Signed)
Requested Prescriptions  Pending Prescriptions Disp Refills  . esomeprazole (NEXIUM) 40 MG capsule [Pharmacy Med Name: ESOMEPRAZOLE MAGNESIUM '40MG'$  DR CAPS] 90 capsule 3    Sig: TAKE 1 CAPSULE(40 MG) BY MOUTH DAILY     Gastroenterology: Proton Pump Inhibitors 2 Passed - 09/12/2022  3:17 AM      Passed - ALT in normal range and within 360 days    ALT  Date Value Ref Range Status  08/19/2022 18 0 - 32 IU/L Final         Passed - AST in normal range and within 360 days    AST  Date Value Ref Range Status  08/19/2022 23 0 - 40 IU/L Final         Passed - Valid encounter within last 12 months    Recent Outpatient Visits          1 month ago Encounter for general adult medical examination with abnormal findings   [facility] [employer], Coralie Keens, NP   8 months ago Gallstones   [facility] [place], Coralie Keens, NP   11 months ago RUQ pain   [facility], Coralie Keens, NP   1 year ago [place], FNP   2 years ago [facility] [place], Lupita Raider, [place]

## 2022-09-12 NOTE — Telephone Encounter (Signed)
Yes, I am aware. This is not something we reach out for. The mammography center reaches out to these patients, schedules follow up and notifies US of the result.

## 2022-09-26 ENCOUNTER — Ambulatory Visit
Admission: RE | Admit: 2022-09-26 | Discharge: 2022-09-26 | Disposition: A | Discharge status: Home / Self Care | Payer: Managed Care, Other (non HMO) | Source: Ambulatory Visit | Attending: Internal Medicine | Admitting: Internal Medicine

## 2022-09-26 DIAGNOSIS — R928 Other abnormal and inconclusive findings on diagnostic imaging of breast: Secondary | ICD-10-CM

## 2022-11-25 ENCOUNTER — Other Ambulatory Visit: Payer: Self-pay | Admitting: Internal Medicine

## 2022-11-25 DIAGNOSIS — M25552 Pain in left hip: Secondary | ICD-10-CM

## 2022-11-25 NOTE — Telephone Encounter (Signed)
Requested Prescriptions  Pending Prescriptions Disp Refills   meloxicam (MOBIC) 15 MG tablet [Pharmacy Med Name: MELOXICAM 15MG TABLETS] 90 tablet 0    Sig: TAKE 1 TABLET(15 MG) BY MOUTH DAILY     Analgesics:  COX2 Inhibitors Failed - 11/25/2022  3:16 AM      Failed - Manual Review: Labs are only required if the patient has taken medication for more than 8 weeks.      Passed - HGB in normal range and within 360 days    Hemoglobin  Date Value Ref Range Status  08/19/2022 12.4 11.1 - 15.9 g/dL Final         Passed - Cr in normal range and within 360 days    Creatinine, Ser  Date Value Ref Range Status  08/19/2022 0.84 0.57 - 1.00 mg/dL Final         Passed - HCT in normal range and within 360 days    Hematocrit  Date Value Ref Range Status  08/19/2022 36.6 34.0 - 46.6 % Final         Passed - AST in normal range and within 360 days    AST  Date Value Ref Range Status  08/19/2022 23 0 - 40 IU/L Final         Passed - ALT in normal range and within 360 days    ALT  Date Value Ref Range Status  08/19/2022 18 0 - 32 IU/L Final         Passed - eGFR is 30 or above and within 360 days    GFR calc Af Amer  Date Value Ref Range Status  07/08/2020 91 >59 mL/min/1.73 Final    Comment:    **[facility] currently reports eGFR in compliance with the current**   recommendations of the [employer]. [facility] will   update reporting as new guidelines are published from the NKF-ASN   Task force.    GFR calc non Af Amer  Date Value Ref Range Status  07/08/2020 79 >59 mL/min/1.73 Final   eGFR  Date Value Ref Range Status  08/19/2022 76 >59 mL/min/1.73 Final         Passed - Patient is not pregnant      Passed - Valid encounter within last 12 months    Recent Outpatient Visits           4 months ago Encounter for general adult medical examination with abnormal findings   [facility], Coralie Keens, NP   10 months ago [facility] [place], Coralie Keens, NP   1 year ago RUQ pain   [facility] [place], Coralie Keens, NP   2 years ago [facility] [place], Lupita Raider, FNP   2 years ago [facility] [place], Lupita Raider, [place]

## 2022-12-16 ENCOUNTER — Encounter: Payer: Self-pay | Admitting: Internal Medicine

## 2022-12-16 ENCOUNTER — Ambulatory Visit (INDEPENDENT_AMBULATORY_CARE_PROVIDER_SITE_OTHER): Payer: Managed Care, Other (non HMO) | Admitting: Internal Medicine

## 2022-12-16 VITALS — BP 128/73 | HR 89 | Temp 97.9°F | Resp 17 | Ht 64.0 in | Wt 201.6 lb

## 2022-12-16 DIAGNOSIS — R058 Other specified cough: Secondary | ICD-10-CM | POA: Diagnosis not present

## 2022-12-16 DIAGNOSIS — J208 Acute bronchitis due to other specified organisms: Secondary | ICD-10-CM

## 2022-12-16 DIAGNOSIS — E6609 Other obesity due to excess calories: Secondary | ICD-10-CM

## 2022-12-16 DIAGNOSIS — G8929 Other chronic pain: Secondary | ICD-10-CM

## 2022-12-16 DIAGNOSIS — M25552 Pain in left hip: Secondary | ICD-10-CM | POA: Diagnosis not present

## 2022-12-16 DIAGNOSIS — E66811 Other obesity due to excess calories: Secondary | ICD-10-CM

## 2022-12-16 MED ORDER — PREMARIN 0.625 MG/GM VA CREA: TOPICAL_CREAM | VAGINAL | 5 refills | Status: DC

## 2022-12-16 MED ORDER — ALBUTEROL SULFATE HFA 108 (90 BASE) MCG/ACT IN AERS
1.0000 | INHALATION_SPRAY | Freq: Four times a day (QID) | RESPIRATORY_TRACT | 1 refills | Status: DC | PRN
Start: 2022-12-16 — End: 2023-02-06

## 2022-12-16 MED ORDER — BENZONATATE 200 MG PO CAPS: 200.0000 mg | ORAL_CAPSULE | Freq: Three times a day (TID) | ORAL | 0 refills | Status: DC | PRN

## 2022-12-16 MED ORDER — PREDNISONE 10 MG PO TABS: ORAL_TABLET | ORAL | 0 refills | Status: DC

## 2022-12-16 NOTE — Assessment & Plan Note (Signed)
Encourage weight loss as this can help reduce joint pain

## 2022-12-16 NOTE — Progress Notes (Signed)
HPI  Pt presents to the clinic today with c/o fatigue, nasal congestion and cough.  She reports this started 1 month ago. She is not blowing anything out of her nose. The cough is productive at times of yellow/green mucous.  She denies headache, runny nose, ear pain, sore throat, shortness of breath, nausea or vomiting. She denies fever, chills or body aches. She did an e-visit 2 weeks ago for the same.  She is diagnosed with bronchitis and treated with Azithromycin, Spiriva and Tessalon Perles.  She reports the cough and the cough persisted.    She also reports persistent left hip pain.  This started 3 months ago.  She reports the pain has gotten so bad that she is unable to put her sock on.  X-ray from 07/2022 showed:  IMPRESSION: No acute osseous abnormality.  She is taking Meloxicam as prescribed.  She has an appoint with [facility] on Tuesday.  Review of Systems      Past Medical History:  Diagnosis Date   Allergy    Bronchitis    GERD (gastroesophageal reflux disease)     Family History  Problem Relation Age of Onset   Cancer Mother        ColoRectal   Cancer Father        Stomach Cancer    Social History   Socioeconomic History   Marital status: Married    Spouse name: Everlean Patterson   Number of children: 1   Years of education: Not on file   Highest education level: Bachelor's degree (e.g., BA, AB, BS)  Occupational History    Employer: [employer]  Tobacco Use   Smoking status: Never   Smokeless tobacco: Never  Vaping Use   Vaping Use: Never used  Substance and Sexual Activity   Alcohol use: Yes    Alcohol/week: 2.0 standard drinks of alcohol    Types: 2 Glasses of wine per week    Comment: weekly   Drug use: Never   Sexual activity: Not on file  Other Topics Concern   Not on file  Social History Narrative   Not on file   Social Determinants of Health   Financial Resource Strain: Not on file  Food Insecurity: Not on file  Transportation Needs: Not on file   Physical Activity: Not on file  Stress: Not on file  Social Connections: Not on file  Intimate Partner Violence: Not on file    Allergies  Allergen Reactions   Penicillins Hives   Scallops [Shellfish Allergy] Nausea And Vomiting and Rash     Constitutional: Positive fatigue. Denies headache, fever or abrupt weight changes.  HEENT:  Denies eye redness, eye pain, pressure behind the eyes, facial pain, nasal congestion, ear pain, ringing in the ears, wax buildup, runny nose or sore throat. Respiratory: Positive cough. Denies difficulty breathing or shortness of breath.  Cardiovascular: Denies chest pain, chest tightness, palpitations or swelling in the hands or feet.  MSK: Patient reports left hip pain.  Denies decrease in range of motion, joint swelling or difficulty with gait.  No other specific complaints in a complete review of systems (except as listed in HPI above).  Objective:   BP 128/73 (BP Location: Left Arm, Patient Position: Sitting, Cuff Size: Normal)   Pulse 89   Temp 97.9 F (36.6 C) (Oral)   Resp 17   Ht '5\' 4"'$  (1.626 m)   Wt 201 lb 9.6 oz (91.4 kg)   SpO2 99%   BMI 34.60 kg/m   Wt  Readings from Last 3 Encounters:  07/21/22 203 lb (92.1 kg)  03/10/22 200 lb 9.6 oz (91 kg)  02/25/22 199 lb (90.3 kg)     General: Appears her stated age, obese in NAD. HEENT: Head: normal shape and size, no sinus tenderness noted; Eyes: sclera white, no icterus, conjunctiva pink; Throat/Mouth: + PND. Teeth present, mucosa erythematous and moist, no exudate noted, no lesions or ulcerations noted.  Neck: No cervical lymphadenopathy.  Cardiovascular: Normal rate and rhythm. S1,S2 noted.  No murmur, rubs or gallops noted.  Pulmonary/Chest: Normal effort and positive vesicular breath sounds with intermittent expiratory wheeze. No respiratory distress. No  rales or ronchi noted.       Assessment & Plan:   Viral Bronchitis, Post Viral Cough Syndrome:  Get some rest and drink  plenty of water Rx for Pred taper x 6 days for symptom management No indication for repeat antibiotics at this time Rx for Tessalon 200 mg every 8 hours as needed for cough Albuterol refilled today  Chronic Left Hip Pain:  Rx for Pred taper x 6 days for symptom management Keep your appointment with [facility] as scheduled  RTC in 2 months for follow-up of chronic conditions   Webb Silversmith, NP

## 2022-12-16 NOTE — Patient Instructions (Signed)

## 2023-02-05 ENCOUNTER — Other Ambulatory Visit: Payer: Self-pay | Admitting: Internal Medicine

## 2023-02-05 DIAGNOSIS — J208 Acute bronchitis due to other specified organisms: Secondary | ICD-10-CM

## 2023-02-06 NOTE — Telephone Encounter (Signed)
Requested Prescriptions  Pending Prescriptions Disp Refills   albuterol (VENTOLIN HFA) 108 (90 Base) MCG/ACT inhaler [Pharmacy Med Name: ALBUTEROL HFA INH (200 PUFFS) 8.5GM] 18 g 1    Sig: INHALE 1 TO 2 PUFFS INTO THE LUNGS EVERY 6 HOURS AS NEEDED FOR SHORTNESS OF BREATH     Pulmonology:  Beta Agonists 2 Passed - 02/05/2023  3:14 AM      Passed - Last BP in normal range    BP Readings from Last 1 Encounters:  12/16/22 128/73         Passed - Last Heart Rate in normal range    Pulse Readings from Last 1 Encounters:  12/16/22 89         Passed - Valid encounter within last 12 months    Recent Outpatient Visits           1 month ago Viral bronchitis   [facility] [place], Coralie Keens, NP   6 months ago Encounter for general adult medical examination with abnormal findings   [facility] [place], Coralie Keens, NP   1 year ago [facility] [place], Coralie Keens, NP   1 year ago RUQ pain   [facility] [place], Coralie Keens, NP   2 years ago [facility] [place], Lupita Raider, [place]

## 2023-02-23 ENCOUNTER — Other Ambulatory Visit: Payer: Self-pay | Admitting: Internal Medicine

## 2023-02-23 DIAGNOSIS — J40 Bronchitis, not specified as acute or chronic: Secondary | ICD-10-CM

## 2023-02-23 NOTE — Telephone Encounter (Signed)
Requested Prescriptions  Pending Prescriptions Disp Refills   [place] 1.25 MCG/ACT AERS [Pharmacy Med Name: SPIRIVA RESPIMAT 1.25MCG IN 4GM 60D] 4 g 2    Sig: INHALE 2 PUFFS INTO THE LUNGS DAILY     Pulmonology:  Anticholinergic Agents Passed - 02/23/2023  3:22 AM      Passed - Valid encounter within last 12 months    Recent Outpatient Visits           2 months ago Viral bronchitis   [facility] [place], Coralie Keens, NP   7 months ago Encounter for general adult medical examination with abnormal findings   [facility] [place], Coralie Keens, NP   1 year ago [facility] [place], Coralie Keens, NP   1 year ago RUQ pain   [facility] [place], Coralie Keens, NP   2 years ago [facility] [place], Lupita Raider, [place]

## 2023-05-24 ENCOUNTER — Other Ambulatory Visit: Payer: Self-pay | Admitting: Internal Medicine

## 2023-05-24 DIAGNOSIS — K219 Gastro-esophageal reflux disease without esophagitis: Secondary | ICD-10-CM

## 2023-05-24 NOTE — Telephone Encounter (Signed)
Unable to refill per protocol, Rx request is too soon. Last refill 09/12/22 for 90 and 3 refill.  Requested Prescriptions  Pending Prescriptions Disp Refills   esomeprazole (NEXIUM) 40 MG capsule [Pharmacy Med Name: ESOMEPRAZOLE MAGNESIUM 40MG  DR CAPS] 90 capsule 3    Sig: TAKE 1 CAPSULE(40 MG) BY MOUTH DAILY     Gastroenterology: Proton Pump Inhibitors 2 Passed - 05/24/2023  8:09 AM      Passed - ALT in normal range and within 360 days    ALT  Date Value Ref Range Status  08/19/2022 18 0 - 32 IU/L Final         Passed - AST in normal range and within 360 days    AST  Date Value Ref Range Status  08/19/2022 23 0 - 40 IU/L Final         Passed - Valid encounter within last 12 months    Recent Outpatient Visits           5 months ago Viral bronchitis   [place] [facility] [place], Salvadore Oxford, NP   10 months ago Encounter for general adult medical examination with abnormal findings   [place] [facility] [place], Salvadore Oxford, NP   1 year ago Gallstones   [place] [facility] [place], Salvadore Oxford, NP   1 year ago RUQ pain   [place] [facility] [place], Salvadore Oxford, NP   2 years ago Bronchitis    [facility] [place], Jodelle Gross, [place]

## 2023-06-28 ENCOUNTER — Encounter: Payer: Managed Care, Other (non HMO) | Admitting: Dermatology

## 2023-07-25 ENCOUNTER — Encounter: Payer: Self-pay | Admitting: Dermatology

## 2023-07-25 ENCOUNTER — Ambulatory Visit: Payer: Managed Care, Other (non HMO) | Admitting: Dermatology

## 2023-07-25 VITALS — BP 120/73 | HR 75

## 2023-07-25 DIAGNOSIS — Z1283 Encounter for screening for malignant neoplasm of skin: Secondary | ICD-10-CM | POA: Diagnosis not present

## 2023-07-25 DIAGNOSIS — L821 Other seborrheic keratosis: Secondary | ICD-10-CM

## 2023-07-25 DIAGNOSIS — L578 Other skin changes due to chronic exposure to nonionizing radiation: Secondary | ICD-10-CM | POA: Diagnosis not present

## 2023-07-25 DIAGNOSIS — I781 Nevus, non-neoplastic: Secondary | ICD-10-CM

## 2023-07-25 DIAGNOSIS — L814 Other melanin hyperpigmentation: Secondary | ICD-10-CM

## 2023-07-25 DIAGNOSIS — L720 Epidermal cyst: Secondary | ICD-10-CM | POA: Diagnosis not present

## 2023-07-25 DIAGNOSIS — W908XXA Exposure to other nonionizing radiation, initial encounter: Secondary | ICD-10-CM

## 2023-07-25 DIAGNOSIS — Z7189 Other specified counseling: Secondary | ICD-10-CM

## 2023-07-25 DIAGNOSIS — D1801 Hemangioma of skin and subcutaneous tissue: Secondary | ICD-10-CM

## 2023-07-25 DIAGNOSIS — D229 Melanocytic nevi, unspecified: Secondary | ICD-10-CM

## 2023-07-25 NOTE — Patient Instructions (Addendum)
Recommend daily broad spectrum sunscreen SPF 30+ to sun-exposed areas, reapply every 2 hours as needed. Call for new or changing lesions.  Staying in the shade or wearing long sleeves, sun glasses (UVA+UVB protection) and wide brim hats (4-inch brim around the entire circumference of the hat) are also recommended for sun protection.    Counseling for BBL / IPL / Laser and Coordination of Care Discussed the treatment option of Broad Band Light (BBL) /Intense Pulsed Light (IPL)/ Laser for skin discoloration, including brown spots and redness.  Typically we recommend at least 1-3 treatment sessions about 5-8 weeks apart for best results.  Cannot have tanned skin when BBL performed, and regular use of sunscreen/photoprotection is advised after the procedure to help maintain results. The patient's condition may also require "maintenance treatments" in the future.  The fee for BBL / laser treatments is $350 per treatment session for the whole face.  A fee can be quoted for other parts of the body.  Insurance typically does not pay for BBL/laser treatments and therefore the fee is an out-of-pocket cost.     Melanoma ABCDEs  Melanoma is the most dangerous type of skin cancer, and is the leading cause of death from skin disease.  You are more likely to develop melanoma if you: Have light-colored skin, light-colored eyes, or red or blond hair Spend a lot of time in the sun Tan regularly, either outdoors or in a tanning bed Have had blistering sunburns, especially during childhood Have a close family member who has had a melanoma Have atypical moles or large birthmarks  Early detection of melanoma is key since treatment is typically straightforward and cure rates are extremely high if we catch it early.   The first sign of melanoma is often a change in a mole or a new dark spot.  The ABCDE system is a way of remembering the signs of melanoma.  A for asymmetry:  The two halves do not match. B for  border:  The edges of the growth are irregular. C for color:  A mixture of colors are present instead of an even brown color. D for diameter:  Melanomas are usually (but not always) greater than 6mm - the size of a pencil eraser. E for evolution:  The spot keeps changing in size, shape, and color.  Please check your skin once per month between visits. You can use a small mirror in front and a large mirror behind you to keep an eye on the back side or your body.   If you see any new or changing lesions before your next follow-up, please call to schedule a visit.  Please continue daily skin protection including broad spectrum sunscreen SPF 30+ to sun-exposed areas, reapplying every 2 hours as needed when you're outdoors.   Staying in the shade or wearing long sleeves, sun glasses (UVA+UVB protection) and wide brim hats (4-inch brim around the entire circumference of the hat) are also recommended for sun protection.    Due to recent changes in healthcare laws, you may see results of your pathology and/or laboratory studies on MyChart before the doctors have had a chance to review them. We understand that in some cases there may be results that are confusing or concerning to you. Please understand that not all results are received at the same time and often the doctors may need to interpret multiple results in order to provide you with the best plan of care or course of treatment. Therefore, we ask that  you please give [place] 2 business days to thoroughly review all your results before contacting the office for clarification. Should we see a critical lab result, you will be contacted sooner.   If You Need Anything After Your Visit  If you have any questions or concerns for your doctor, please call our main line at [phone] and press option 4 to reach your doctor's medical assistant. If no one answers, please leave a voicemail as directed and we will return your call as soon as possible. Messages left  after 4 pm will be answered the following business day.   You may also send [place] a message via MyChart. We typically respond to MyChart messages within 1-2 business days.  For prescription refills, please ask your pharmacy to contact our office. Our fax number is [phone].  If you have an urgent issue when the clinic is closed that cannot wait until the next business day, you can page your doctor at the number below.    Please note that while we do our best to be available for urgent issues outside of office hours, we are not available 24/7.   If you have an urgent issue and are unable to reach [place], you may choose to seek medical care at your doctor's office, retail clinic, urgent care center, or emergency room.  If you have a medical emergency, please immediately call 911 or go to the emergency department.  Pager Numbers  - Dr. Gwen Pounds: [phone]  - Dr. Roseanne Reno: [phone]  In the event of inclement weather, please call our main line at [phone] for an update on the status of any delays or closures.  Dermatology Medication Tips: Please keep the boxes that topical medications come in in order to help keep track of the instructions about where and how to use these. Pharmacies typically print the medication instructions only on the boxes and not directly on the medication tubes.   If your medication is too expensive, please contact our office at [phone] option 4 or send [place] a message through MyChart.   We are unable to tell what your co-pay for medications will be in advance as this is different depending on your insurance coverage. However, we may be able to find a substitute medication at lower cost or fill out paperwork to get insurance to cover a needed medication.   If a prior authorization is required to get your medication covered by your insurance company, please allow [place] 1-2 business days to complete this process.  Drug prices often vary depending on where the  prescription is filled and some pharmacies may offer cheaper prices.  The website www.goodrx.com contains coupons for medications through different pharmacies. The prices here do not account for what the cost may be with help from insurance (it may be cheaper with your insurance), but the website can give you the price if you did not use any insurance.  - You can print the associated coupon and take it with your prescription to the pharmacy.  - You may also stop by our office during regular business hours and pick up a GoodRx coupon card.  - If you need your prescription sent electronically to a different pharmacy, notify our office through [facility] or by phone at [phone] option 4.     Si Usted Necesita Algo Despus de Su Visita  Tambin puede enviarnos un mensaje a travs de Clinical cytogeneticist. Por lo general respondemos a los mensajes de MyChart en el transcurso de 1 a 2 [address]  [address]  hb[place].  Para renovar recetas, por favor pida a su farmacia que se ponga en contacto con nuestra oficina. Annie Sable de fax es [place] [phone].  Si tiene un asunto urgente cuando la clnica est cerrada y que no puede esperar hasta el siguiente da hbil, puede llamar/localizar a su doctor(a) al nmero que aparece a continuacin.   Por favor, tenga en cuenta que aunque hacemos todo lo posible para estar disponibles para asuntos urgentes fuera del horario de [place], no estamos disponibles las 24 horas del da, los 7 [address]  [address] de la [place].   Si tiene un problema urgente y no puede comunicarse con nosotros, puede optar por buscar atencin mdica  en el consultorio de su doctor(a), en una clnica privada, en un centro de atencin urgente o en una sala de emergencias.  Si tiene Engineer, drilling, por favor llame inmediatamente al 911 o vaya a la sala de emergencias.  Nmeros de bper  - Dr. Gwen Pounds: [phone]  - Dra. Roseanne Reno: [phone]  En caso de inclemencias del [place], por favor llame a Lacy Duverney principal al [phone] para una actualizacin sobre el [place] de cualquier retraso o cierre.  Consejos para la medicacin en dermatologa: Por favor, guarde las cajas en las que vienen los medicamentos de uso tpico para ayudarle a seguir las instrucciones sobre dnde y cmo usarlos. Las farmacias generalmente imprimen las instrucciones del medicamento slo en las cajas y no directamente en los tubos del [place].   Si su medicamento es muy caro, por favor, pngase en contacto con Rolm [place] llamando al [phone] y presione la opcin 4 o envenos un mensaje a travs de Clinical cytogeneticist.   No podemos decirle cul ser su copago por los medicamentos por adelantado ya que esto es diferente dependiendo de la cobertura de su seguro. Sin embargo, es posible que podamos encontrar un medicamento sustituto a Audiological scientist un formulario para que el seguro cubra el medicamento que se considera necesario.   Si se requiere una autorizacin previa para que su compaa de seguros [place] su medicamento, por favor permtanos de 1 a 2 das hbiles para completar [address]  Los precios de los medicamentos varan con frecuencia dependiendo del Environmental consultant de dnde se surte la receta y alguna farmacias pueden ofrecer precios ms baratos.  El sitio web www.goodrx.com tiene cupones para medicamentos de Health and safety inspector. Los precios aqu no tienen en cuenta lo que podra costar con la ayuda del seguro (puede ser ms barato con su seguro), pero el sitio web puede darle el precio si no utiliz Tourist information centre manager.  - Puede imprimir el cupn correspondiente y llevarlo con su receta a la farmacia.  - Tambin puede pasar por nuestra oficina durante el horario de atencin regular y Education officer, museum una tarjeta de cupones de GoodRx.  - Si necesita que su receta se enve electrnicamente a una farmacia diferente, informe a nuestra oficina a travs de MyChart de [place] o por telfono llamando al [phone] y presione la  opcin 4.

## 2023-07-25 NOTE — Progress Notes (Signed)
Follow-Up Visit   Subjective  Samantha Whitney is a 67 y.o. female who presents for the following: Skin Cancer Screening and Full Body Skin Exam. No personal Hx of skin cancer or dysplastic nevi. New lesion on left arm that is bothersome.   The patient presents for Total-Body Skin Exam (TBSE) for skin cancer screening and mole check. The patient has spots, moles and lesions to be evaluated, some may be new or changing and the patient may have concern these could be cancer.    The following portions of the chart were reviewed this encounter and updated as appropriate: medications, allergies, medical history  Review of Systems:  No other skin or systemic complaints except as noted in HPI or Assessment and Plan.  Objective  Well appearing patient in no apparent distress; mood and affect are within normal limits.  A full examination was performed including scalp, head, eyes, ears, nose, lips, neck, chest, axillae, abdomen, back, buttocks, bilateral upper extremities, bilateral lower extremities, hands, feet, fingers, toes, fingernails, and toenails. All findings within normal limits unless otherwise noted below.   Relevant physical exam findings are noted in the Assessment and Plan.       Assessment & Plan   SKIN CANCER SCREENING PERFORMED TODAY.  ACTINIC DAMAGE - Chronic condition, secondary to cumulative UV/sun exposure - diffuse scaly erythematous macules with underlying dyspigmentation - Recommend daily broad spectrum sunscreen SPF 30+ to sun-exposed areas, reapply every 2 hours as needed.  - Staying in the shade or wearing long sleeves, sun glasses (UVA+UVB protection) and wide brim hats (4-inch brim around the entire circumference of the hat) are also recommended for sun protection.  - Call for new or changing lesions.  LENTIGINES, SEBORRHEIC KERATOSES, HEMANGIOMAS - Benign normal skin lesions - Benign-appearing - Call for any changes  MELANOCYTIC NEVI - Tan-brown and/or  pink-flesh-colored symmetric macules and papules - Benign appearing on exam today - Observation - Call clinic for new or changing moles - Recommend daily use of broad spectrum spf 30+ sunscreen to sun-exposed areas.    SEBORRHEIC KERATOSIS - Stuck-on, waxy, tan-brown papule at left tip of nose.  - Benign-appearing - Discussed benign etiology and prognosis. - Observe - Call for any changes   [place] - tiny firm white papule at left ventral forearm - type of cyst - benign - sometimes these will clear with nightly OTC adapalene/Differin 0.1% gel or retinol. - may be extracted if symptomatic - observe   TELANGIECTASIA Exam: dilated blood vessels at left anterior lower leg  Treatment Plan: Benign appearing on exam Call for changes   Counseling for BBL / IPL / Laser and Coordination of Care Discussed the treatment option of Broad Band Light (BBL) [place] Pulsed Light (IPL)/ Laser for skin discoloration, including brown spots and redness.  Typically we recommend at least 1-3 treatment sessions about 5-8 weeks apart for best results.  Cannot have tanned skin when BBL performed, and regular use of sunscreen/photoprotection is advised after the procedure to help maintain results. The patient's condition may also require "maintenance treatments" in the future.  The fee for BBL / laser treatments is $350 per treatment session for the whole face.  A fee can be quoted for other parts of the body.  Insurance typically does not pay for BBL/laser treatments and therefore the fee is an out-of-pocket cost.    Return in about 1 year (around 07/24/2024) for TBSE.  I, Lawson Radar, CMA, am acting as scribe for Elie Goody, MD.   Documentation:  I have reviewed the above documentation for accuracy and completeness, and I agree with the above.  Elie Goody, MD

## 2023-08-14 ENCOUNTER — Other Ambulatory Visit: Payer: Self-pay | Admitting: Internal Medicine

## 2023-08-14 DIAGNOSIS — K219 Gastro-esophageal reflux disease without esophagitis: Secondary | ICD-10-CM

## 2023-08-14 NOTE — Telephone Encounter (Signed)
Medication Refill - Medication: esomeprazole (NEXIUM) 40 MG capsule   Has the patient contacted their pharmacy? Yes.   Pt told to contact provider  Preferred Pharmacy (with phone number or street name):  [id number] - [place], [place] - [address] AT [facility] OF [address] & [address] [facility] Phone: [phone]  Fax: [phone]     Has the patient been seen for an appointment in the last year OR does the patient have an upcoming appointment? Yes.    Agent: Please be advised that RX refills may take up to 3 business days. We ask that you follow-up with your pharmacy.

## 2023-08-15 MED ORDER — ESOMEPRAZOLE MAGNESIUM 40 MG PO CPDR: DELAYED_RELEASE_CAPSULE | ORAL | 0 refills | Status: DC

## 2023-08-15 NOTE — Telephone Encounter (Signed)
Requested Prescriptions  Pending Prescriptions Disp Refills   esomeprazole (NEXIUM) 40 MG capsule 90 capsule 3    Sig: TAKE 1 CAPSULE(40 MG) BY MOUTH DAILY     Gastroenterology: Proton Pump Inhibitors 2 Passed - 08/14/2023 11:20 AM      Passed - ALT in normal range and within 360 days    ALT  Date Value Ref Range Status  08/19/2022 18 0 - 32 IU/L Final         Passed - AST in normal range and within 360 days    AST  Date Value Ref Range Status  08/19/2022 23 0 - 40 IU/L Final         Passed - Valid encounter within last 12 months    Recent Outpatient Visits           8 months ago Viral bronchitis   [place] [facility] [place], Salvadore Oxford, NP   1 year ago Encounter for general adult medical examination with abnormal findings   [place] [facility] [place], Salvadore Oxford, NP   1 year ago Gallstones   [place] [facility] [place], Salvadore Oxford, NP   1 year ago RUQ pain   [place] [facility] [place], Salvadore Oxford, NP   2 years ago Bronchitis   [place] [facility] [place], Jodelle Gross, FNP       Future Appointments             In 3 weeks [place], Salvadore Oxford, NP [place] [facility]   In 11 months Elie Goody, MD [facility]

## 2023-09-08 ENCOUNTER — Ambulatory Visit (INDEPENDENT_AMBULATORY_CARE_PROVIDER_SITE_OTHER): Payer: Managed Care, Other (non HMO) | Admitting: Internal Medicine

## 2023-09-08 VITALS — BP 118/70 | HR 78 | Wt 183.0 lb

## 2023-09-08 DIAGNOSIS — Z23 Encounter for immunization: Secondary | ICD-10-CM

## 2023-09-08 DIAGNOSIS — R7303 Prediabetes: Secondary | ICD-10-CM

## 2023-09-08 DIAGNOSIS — E7800 Pure hypercholesterolemia, unspecified: Secondary | ICD-10-CM | POA: Diagnosis not present

## 2023-09-08 DIAGNOSIS — Z1231 Encounter for screening mammogram for malignant neoplasm of breast: Secondary | ICD-10-CM

## 2023-09-08 DIAGNOSIS — Z6831 Body mass index (BMI) 31.0-31.9, adult: Secondary | ICD-10-CM

## 2023-09-08 DIAGNOSIS — Z0001 Encounter for general adult medical examination with abnormal findings: Secondary | ICD-10-CM | POA: Diagnosis not present

## 2023-09-08 DIAGNOSIS — E6609 Other obesity due to excess calories: Secondary | ICD-10-CM

## 2023-09-08 MED ORDER — AZITHROMYCIN 250 MG PO TABS
ORAL_TABLET | ORAL | 0 refills | Status: DC
Start: 2023-09-08 — End: 2024-03-15

## 2023-09-08 NOTE — Assessment & Plan Note (Signed)
Encourage diet and exercise for weight loss

## 2023-09-08 NOTE — Progress Notes (Unsigned)
Subjective:    Patient ID: Samantha Whitney, female    DOB: 01/30/1956, 67 y.o.   MRN: [id number]  HPI  Patient presents to clinic today for her annual exam.  Flu: 09/2022 Tetanus: 02/2019 COVID: X 3 Pneumovax: Prevnar 20: [facility] Shingrix: 02/2019, 10/2019 Pap smear: 02/2019 Mammogram: 09/2022 Bone density: 07/2022 Colon screening: 01/2022 Vision screening: annually Dentist: biannually  Diet: She does eat meat. She consumes fruits and veggies. She tries to avoid fried foods. She drinks mostly water, tea, coffee Exercise: Walking, yoga   Review of Systems     Past Medical History:  Diagnosis Date   Allergy    Bronchitis    GERD (gastroesophageal reflux disease)     Current Outpatient Medications  Medication Sig Dispense Refill   albuterol (VENTOLIN HFA) 108 (90 Base) MCG/ACT inhaler INHALE 1 TO 2 PUFFS INTO THE LUNGS EVERY 6 HOURS AS NEEDED FOR SHORTNESS OF BREATH 18 g 1   benzonatate (TESSALON) 200 MG capsule Take 1 capsule (200 mg total) by mouth 3 (three) times daily as needed. 30 capsule 0   Calcium Carbonate-Vitamin D (CALTRATE 600+D PO) Take 1 tablet by mouth daily.     Cholecalciferol (VITAMIN D3 PO) Take by mouth.     clobetasol (OLUX) 0.05 % topical foam apply twice daily as needed to affected areas scalp/ears for two weeks then apply twice daily on weekends only, Avoid applying to face, groin, and axilla. Use as directed. Risk of skin atrophy with long-term use reviewed. 50 g 3   conjugated estrogens (PREMARIN) vaginal cream Use a fingertip amount at opening of vagina nightly for vaginal dryness. 42.5 g 5   esomeprazole (NEXIUM) 40 MG capsule TAKE 1 CAPSULE(40 MG) BY MOUTH DAILY 90 capsule 0   hydrOXYzine (ATARAX) 10 MG tablet Take 1 tablet (10 mg total) by mouth at bedtime as needed. 30 tablet 2   ketoconazole (NIZORAL) 2 % shampoo Shampoo into the scalp let sit 5-10 minutes then wash out. Use 3 days per week. 120 mL 6   loratadine (CLARITIN) 10 MG tablet Take  10 mg by mouth daily.     meloxicam (MOBIC) 15 MG tablet TAKE 1 TABLET(15 MG) BY MOUTH DAILY 90 tablet 0   Multiple Vitamin (MULTIVITAMIN WITH MINERALS) TABS tablet Take 1 tablet by mouth 4 (four) times a week.     Naproxen Sod-diphenhydrAMINE (ALEVE PM) 220-25 MG TABS Take 1 tablet by mouth at bedtime as needed (sleep).     predniSONE (DELTASONE) 10 MG tablet Take 6 tabs on day 1, 5 tabs on day 2, 4 tabs on day 3, 3 tabs on day 4, 2 tabs on day 5, 1 tab on day 6 21 tablet 0   SPIRIVA RESPIMAT 1.25 MCG/ACT AERS INHALE 2 PUFFS INTO THE LUNGS DAILY 4 g 2   No current facility-administered medications for this visit.    Allergies  Allergen Reactions   Penicillins Hives   Scallops [Shellfish Allergy] Nausea And Vomiting and Rash    Family History  Problem Relation Age of Onset   Cancer Mother        ColoRectal   Cancer Father        Stomach Cancer    Social History   Socioeconomic History   Marital status: Married    Spouse name: Donah Driver   Number of children: 1   Years of education: Not on file   Highest education level: Bachelor's degree (e.g., BA, AB, BS)  Occupational History    Employer: [employer]  Tobacco Use   Smoking status: Never   Smokeless tobacco: Never  Vaping Use   Vaping status: Never Used  Substance and Sexual Activity   Alcohol use: Yes    Alcohol/week: 2.0 standard drinks of alcohol    Types: 2 Glasses of wine per week    Comment: weekly   Drug use: Never   Sexual activity: Not on file  Other Topics Concern   Not on file  Social History Narrative   Not on file   Social Determinants of Health   Financial Resource Strain: Not on file  Food Insecurity: Not on file  Transportation Needs: Not on file  Physical Activity: Not on file  Stress: Not on file  Social Connections: Not on file  Intimate Partner Violence: Not on file     Constitutional: Denies fever, malaise, fatigue, headache or abrupt weight changes.  HEENT: Denies eye pain, eye redness,  ear pain, ringing in the ears, wax buildup, runny nose, nasal congestion, bloody nose, or sore throat. Respiratory: Patient reports chronic cough.  Denies difficulty breathing, shortness of breath, or sputum production.   Cardiovascular: Denies chest pain, chest tightness, palpitations or swelling in the hands or feet.  Gastrointestinal: Pt reports intermittent nausea, constipation and diarrhea (wegovy). Denies abdominal pain, bloating.  GU: Pt reports intermittent urgency. Denies urgency, frequency, pain with urination, burning sensation, blood in urine, odor or discharge. Musculoskeletal: Pt reports intermittent hip pain. Denies decrease in range of motion, difficulty with gait, muscle pain or joint swelling.  Skin: Denies redness, rashes, lesions or ulcercations.  Neurological: Patient reports insomnia.  Denies dizziness, difficulty with memory, difficulty with speech or problems with balance and coordination.  Psych: Denies anxiety, depression, SI/HI.  No other specific complaints in a complete review of systems (except as listed in HPI above).  Objective:   Physical Exam   BP 118/70 (BP Location: Left Arm, Patient Position: Sitting, Cuff Size: Normal)   Pulse 78   Wt 183 lb (83 kg)   SpO2 98%   BMI 31.41 kg/m   Wt Readings from Last 3 Encounters:  12/16/22 201 lb 9.6 oz (91.4 kg)  07/21/22 203 lb (92.1 kg)  03/10/22 200 lb 9.6 oz (91 kg)    General: Appears her stated age, obese, in NAD. Skin: Warm, dry and intact. HEENT: Head: normal shape and size; Eyes: sclera white, no icterus, conjunctiva pink, PERRLA and EOMs intact;  Neck:  Neck supple, trachea midline. No masses, lumps or thyromegaly present.  Cardiovascular: Normal rate and rhythm. S1,S2 noted.  No murmur, rubs or gallops noted. No JVD or BLE edema. No carotid bruits noted. Pulmonary/Chest: Normal effort and coarse breath sounds. No respiratory distress. No wheezes, rales or ronchi noted.  Abdomen:  Normal bowel  sounds.  Musculoskeletal: Strength 5/5 BUE/BLE. No difficulty with gait.  Neurological: Alert and oriented. Cranial nerves II-XII grossly intact. Coordination normal.  Psychiatric: Mood and affect normal. Behavior is normal. Judgment and thought content normal.    BMET    Component Value Date/Time   NA 142 08/19/2022 1027   K 4.8 08/19/2022 1027   CL 105 08/19/2022 1027   CO2 23 08/19/2022 1027   GLUCOSE 93 08/19/2022 1027   BUN 14 08/19/2022 1027   CREATININE 0.84 08/19/2022 1027   CALCIUM 9.4 08/19/2022 1027   GFRNONAA 79 07/08/2020 0916   GFRAA 91 07/08/2020 0916    Lipid Panel     Component Value Date/Time   CHOL 196 08/19/2022 1027   TRIG 106  08/19/2022 1027   HDL 74 08/19/2022 1027   CHOLHDL 2.6 08/19/2022 1027   LDLCALC 103 (H) 08/19/2022 1027    CBC    Component Value Date/Time   WBC 5.2 08/19/2022 1027   RBC 4.13 08/19/2022 1027   HGB 12.4 08/19/2022 1027   HCT 36.6 08/19/2022 1027   PLT 250 08/19/2022 1027   MCV 89 08/19/2022 1027   MCH 30.0 08/19/2022 1027   MCHC 33.9 08/19/2022 1027   RDW 13.2 08/19/2022 1027   LYMPHSABS 1.9 02/18/2022 0939   EOSABS 0.2 02/18/2022 0939   BASOSABS 0.1 02/18/2022 0939    Hgb A1C Lab Results  Component Value Date   HGBA1C 5.9 (H) 08/19/2022           Assessment & Plan:   Preventative health maintenance:  Flu shot today Tetanus UTD Encouraged her to get her COVID-vaccine Prevnar 20 done at pharmacy prior, will request record of this Shingrix UTD She no longer needs to screen for cervical cancer Mammogram ordered-she will call to schedule Bone density UTD Colon screening UTD Encouraged her to consume a balanced diet and exercise regimen Advised her to see an eye doctor and dentist annually We will check CBC, c-Met, lipid, A1c today  Cough:  Will try azithromycin 250 mg daily x 5 days Continue spiriva and albuterol  RTC in 6 months, follow-up chronic conditions Nicki Reaper, NP

## 2023-09-08 NOTE — Patient Instructions (Signed)
Health Maintenance for Postmenopausal Women Menopause is a normal process in which your ability to get pregnant comes to an end. This process happens slowly over many months or years, usually between the ages of 48 and 55. Menopause is complete when you have missed your menstrual period for 12 months. It is important to talk with your health care provider about some of the most common conditions that affect women after menopause (postmenopausal women). These include heart disease, cancer, and bone loss (osteoporosis). Adopting a healthy lifestyle and getting preventive care can help to promote your health and wellness. The actions you take can also lower your chances of developing some of these common conditions. What are the signs and symptoms of menopause? During menopause, you may have the following symptoms: Hot flashes. These can be moderate or severe. Night sweats. Decrease in sex drive. Mood swings. Headaches. Tiredness (fatigue). Irritability. Memory problems. Problems falling asleep or staying asleep. Talk with your health care provider about treatment options for your symptoms. Do I need hormone replacement therapy? Hormone replacement therapy is effective in treating symptoms that are caused by menopause, such as hot flashes and night sweats. Hormone replacement carries certain risks, especially as you become older. If you are thinking about using estrogen or estrogen with progestin, discuss the benefits and risks with your health care provider. How can I reduce my risk for heart disease and stroke? The risk of heart disease, heart attack, and stroke increases as you age. One of the causes may be a change in the body's hormones during menopause. This can affect how your body uses dietary fats, triglycerides, and cholesterol. Heart attack and stroke are medical emergencies. There are many things that you can do to help prevent heart disease and stroke. Watch your blood pressure High  blood pressure causes heart disease and increases the risk of stroke. This is more likely to develop in people who have high blood pressure readings or are overweight. Have your blood pressure checked: Every 3-5 years if you are 18-39 years of age. Every year if you are 40 years old or older. Eat a healthy diet  Eat a diet that includes plenty of vegetables, fruits, low-fat dairy products, and lean protein. Do not eat a lot of foods that are high in solid fats, added sugars, or sodium. Get regular exercise Get regular exercise. This is one of the most important things you can do for your health. Most adults should: Try to exercise for at least 150 minutes each week. The exercise should increase your heart rate and make you sweat (moderate-intensity exercise). Try to do strengthening exercises at least twice each week. Do these in addition to the moderate-intensity exercise. Spend less time sitting. Even light physical activity can be beneficial. Other tips Work with your health care provider to achieve or maintain a healthy weight. Do not use any products that contain nicotine or tobacco. These products include cigarettes, chewing tobacco, and vaping devices, such as e-cigarettes. If you need help quitting, ask your health care provider. Know your numbers. Ask your health care provider to check your cholesterol and your blood sugar (glucose). Continue to have your blood tested as directed by your health care provider. Do I need screening for cancer? Depending on your health history and family history, you may need to have cancer screenings at different stages of your life. This may include screening for: Breast cancer. Cervical cancer. Lung cancer. Colorectal cancer. What is my risk for osteoporosis? After menopause, you may be   at increased risk for osteoporosis. Osteoporosis is a condition in which bone destruction happens more quickly than new bone creation. To help prevent osteoporosis or  the bone fractures that can happen because of osteoporosis, you may take the following actions: If you are 19-50 years old, get at least 1,000 mg of calcium and at least 600 international units (IU) of vitamin D per day. If you are older than age 50 but younger than age 70, get at least 1,200 mg of calcium and at least 600 international units (IU) of vitamin D per day. If you are older than age 70, get at least 1,200 mg of calcium and at least 800 international units (IU) of vitamin D per day. Smoking and drinking excessive alcohol increase the risk of osteoporosis. Eat foods that are rich in calcium and vitamin D, and do weight-bearing exercises several times each week as directed by your health care provider. How does menopause affect my mental health? Depression may occur at any age, but it is more common as you become older. Common symptoms of depression include: Feeling depressed. Changes in sleep patterns. Changes in appetite or eating patterns. Feeling an overall lack of motivation or enjoyment of activities that you previously enjoyed. Frequent crying spells. Talk with your health care provider if you think that you are experiencing any of these symptoms. General instructions See your health care provider for regular wellness exams and vaccines. This may include: Scheduling regular health, dental, and eye exams. Getting and maintaining your vaccines. These include: Influenza vaccine. Get this vaccine each year before the flu season begins. Pneumonia vaccine. Shingles vaccine. Tetanus, diphtheria, and pertussis (Tdap) booster vaccine. Your health care provider may also recommend other immunizations. Tell your health care provider if you have ever been abused or do not feel safe at home. Summary Menopause is a normal process in which your ability to get pregnant comes to an end. This condition causes hot flashes, night sweats, decreased interest in sex, mood swings, headaches, or lack  of sleep. Treatment for this condition may include hormone replacement therapy. Take actions to keep yourself healthy, including exercising regularly, eating a healthy diet, watching your weight, and checking your blood pressure and blood sugar levels. Get screened for cancer and depression. Make sure that you are up to date with all your vaccines. This information is not intended to replace advice given to you by your health care provider. Make sure you discuss any questions you have with your health care provider. Document Revised: 04/26/2021 Document Reviewed: 04/26/2021 Elsevier Patient Education  2024 [employer]  

## 2023-09-23 LAB — COMPREHENSIVE METABOLIC PANEL
ALT: 16 [IU]/L (ref 0–32)
AST: 23 [IU]/L (ref 0–40)
Albumin: 4.1 g/dL (ref 3.9–4.9)
Alkaline Phosphatase: 53 [IU]/L (ref 44–121)
BUN/Creatinine Ratio: 17 (ref 12–28)
BUN: 14 mg/dL (ref 8–27)
Bilirubin Total: 0.4 mg/dL (ref 0.0–1.2)
CO2: 22 mmol/L (ref 20–29)
Calcium: 9.2 mg/dL (ref 8.7–10.3)
Chloride: 102 mmol/L (ref 96–106)
Creatinine, Ser: 0.82 mg/dL (ref 0.57–1.00)
Globulin, Total: 2.7 g/dL (ref 1.5–4.5)
Glucose: 92 mg/dL (ref 70–99)
Potassium: 4.6 mmol/L (ref 3.5–5.2)
Sodium: 140 mmol/L (ref 134–144)
Total Protein: 6.8 g/dL (ref 6.0–8.5)
eGFR: 78 mL/min/{1.73_m2} (ref >59)

## 2023-09-23 LAB — CBC
Hematocrit: 39.9 % (ref 34.0–46.6)
Hemoglobin: 12.8 g/dL (ref 11.1–15.9)
MCH: 30 pg (ref 26.6–33.0)
MCHC: 32.1 g/dL (ref 31.5–35.7)
MCV: 94 fL (ref 79–97)
Platelets: 293 10*3/uL (ref 150–450)
RBC: 4.26 x10E6/uL (ref 3.77–5.28)
RDW: 13 % (ref 11.7–15.4)
WBC: 5.9 10*3/uL (ref 3.4–10.8)

## 2023-09-23 LAB — LIPID PANEL
Chol/HDL Ratio: 3 {ratio} (ref 0.0–4.4)
Cholesterol, Total: 191 mg/dL (ref 100–199)
HDL: 63 mg/dL (ref >39)
LDL Chol Calc (NIH): 102 mg/dL — ABNORMAL HIGH (ref 0–99)
Triglycerides: 149 mg/dL (ref 0–149)
VLDL Cholesterol Cal: 26 mg/dL (ref 5–40)

## 2023-09-23 LAB — MAGNESIUM: Magnesium: 2.1 mg/dL (ref 1.6–2.3)

## 2023-09-23 LAB — HEMOGLOBIN A1C
Est. average glucose Bld gHb Est-mCnc: 111 mg/dL
Hgb A1c MFr Bld: 5.5 % (ref 4.8–5.6)

## 2023-11-16 ENCOUNTER — Other Ambulatory Visit: Payer: Self-pay | Admitting: Internal Medicine

## 2023-11-16 DIAGNOSIS — K219 Gastro-esophageal reflux disease without esophagitis: Secondary | ICD-10-CM

## 2023-11-20 NOTE — Telephone Encounter (Signed)
Requested Prescriptions  Pending Prescriptions Disp Refills   esomeprazole (NEXIUM) 40 MG capsule [Pharmacy Med Name: ESOMEPRAZOLE MAGNESIUM 40MG  DR CAPS] 90 capsule 0    Sig: TAKE 1 CAPSULE(40 MG) BY MOUTH DAILY     Gastroenterology: Proton Pump Inhibitors 2 Passed - 11/16/2023 11:46 AM      Passed - ALT in normal range and within 360 days    ALT  Date Value Ref Range Status  09/22/2023 16 0 - 32 IU/L Final         Passed - AST in normal range and within 360 days    AST  Date Value Ref Range Status  09/22/2023 23 0 - 40 IU/L Final         Passed - Valid encounter within last 12 months    Recent Outpatient Visits           2 months ago Encounter for general adult medical examination with abnormal findings   [facility] [place], Salvadore Oxford, NP   11 months ago Viral bronchitis   [place] [place], Salvadore Oxford, NP   1 year ago Encounter for general adult medical examination with abnormal findings   [place] [facility] [place], Salvadore Oxford, NP   1 year ago Gallstones   [place] [facility] [place], Salvadore Oxford, NP   2 years ago RUQ pain   [place] [facility] [place], Salvadore Oxford, NP       Future Appointments             In 3 months Baity, Salvadore Oxford, NP [facility]   In 8 months Elie Goody, MD [facility]

## 2024-03-08 ENCOUNTER — Ambulatory Visit: Payer: Self-pay | Admitting: Internal Medicine

## 2024-03-15 ENCOUNTER — Ambulatory Visit: Admitting: Internal Medicine

## 2024-03-15 ENCOUNTER — Encounter: Payer: Self-pay | Admitting: Internal Medicine

## 2024-03-15 VITALS — BP 108/60 | Ht 64.0 in | Wt 173.0 lb

## 2024-03-15 DIAGNOSIS — Z6829 Body mass index (BMI) 29.0-29.9, adult: Secondary | ICD-10-CM

## 2024-03-15 DIAGNOSIS — F5101 Primary insomnia: Secondary | ICD-10-CM

## 2024-03-15 DIAGNOSIS — R7303 Prediabetes: Secondary | ICD-10-CM | POA: Diagnosis not present

## 2024-03-15 DIAGNOSIS — M25551 Pain in right hip: Secondary | ICD-10-CM

## 2024-03-15 DIAGNOSIS — E663 Overweight: Secondary | ICD-10-CM | POA: Diagnosis not present

## 2024-03-15 DIAGNOSIS — G8929 Other chronic pain: Secondary | ICD-10-CM | POA: Insufficient documentation

## 2024-03-15 DIAGNOSIS — K219 Gastro-esophageal reflux disease without esophagitis: Secondary | ICD-10-CM

## 2024-03-15 DIAGNOSIS — E7800 Pure hypercholesterolemia, unspecified: Secondary | ICD-10-CM

## 2024-03-15 MED ORDER — ESOMEPRAZOLE MAGNESIUM 40 MG PO CPDR: DELAYED_RELEASE_CAPSULE | ORAL | 1 refills | Status: DC

## 2024-03-15 NOTE — Assessment & Plan Note (Signed)
 Encourage diet and exercise for weight loss

## 2024-03-15 NOTE — Patient Instructions (Signed)
Hip Exercises Ask your health care provider which exercises are safe for you. Do exercises exactly as told by your provider and adjust them as told. It is normal to feel mild stretching, pulling, tightness, or discomfort as you do these exercises. Stop right away if you feel sudden pain or your pain gets worse. Do not begin these exercises until told by your provider. Stretching and range-of-motion exercises These exercises warm up your muscles and joints and improve the movement and flexibility of your hip. They also help to relieve pain, numbness, and tingling. You may be asked to limit your range of motion if you had a hip replacement. Talk to your provider about these limits. Hamstrings, supine  Lie on your back (supine position). Loop a belt, towel, or exercise band over the ball of your left / right foot. The ball of your foot is on the walking surface, right under your toes. Straighten your left / right knee and slowly pull on the belt, towel, or band to raise your leg until you feel a gentle stretch behind your knee (hamstring). Do not let your knee bend while you do this. Keep your other leg flat on the floor. Hold this position for __________ seconds. Slowly return your leg to the starting position. Repeat __________ times. Complete this exercise __________ times a day. Hip rotation  Lie on your back on a firm surface. With your left / right hand, gently pull your left / right knee toward the shoulder that is on the same side of the body. Stop when your knee is pointing toward the ceiling. Hold your left / right ankle with your other hand. Keeping your knee steady, gently pull your left / right ankle toward your other shoulder until you feel a stretch in your butt. Keep your hips and shoulders firmly planted while you do this stretch. Hold this position for __________ seconds. Repeat __________ times. Complete this exercise __________ times a day. Seated stretch This exercise is  sometimes called hamstrings and adductors stretch. Sit on the floor with your legs stretched wide. Keep your knees straight during this exercise. Keeping your head and back in a straight line, bend at your waist to reach for your left foot (position A). You should feel a stretch in your right inner thigh (adductors). Hold this position for __________ seconds. Then slowly return to the upright position. Keeping your head and back in a straight line, bend at your waist to reach forward (position B). You should feel a stretch behind both of your thighs and knees (hamstrings). Hold this position for __________ seconds. Then slowly return to the upright position. Keeping your head and back in a straight line, bend at your waist to reach for your right foot (position C). You should feel a stretch in your left inner thigh (adductors). Hold this position for __________ seconds. Then slowly return to the upright position. Repeat __________ times. Complete this exercise __________ times a day. Lunge This exercise stretches the muscles of the hip (hip flexors). Place your left / right knee on the floor and bend your other knee so that is directly over your ankle. You should be half-kneeling. Keep good posture with your head over your shoulders. Tighten your butt muscles to point your tailbone downward. This will prevent your back from arching too much. You should feel a gentle stretch in the front of your left / right thigh and hip. If you do not feel a stretch, slide your other foot forward slightly and  then slowly lunge forward with your chest up until your knee once again lines up over your ankle. Make sure your tailbone continues to point downward. Hold this position for __________ seconds. Slowly return to the starting position. Repeat __________ times. Complete this exercise __________ times a day. Strengthening exercises These exercises build strength and endurance in your hip. Endurance is the  ability to use your muscles for a long time, even after they get tired. Bridge This exercise strengthens the muscles of your hip (hip extensors). Lie on your back on a firm surface with your knees bent and your feet flat on the floor. Tighten your butt muscles and lift your bottom off the floor until the trunk of your body and your hips are level with your thighs. Do not arch your back. You should feel the muscles working in your butt and the back of your thighs. If you do not feel these muscles, slide your feet 1-2 inches (2.5-5 cm) farther away from your butt. Hold this position for __________ seconds. Slowly lower your hips to the starting position. Let your muscles relax completely between repetitions. Repeat __________ times. Complete this exercise __________ times a day. Straight leg raises, side-lying This exercise strengthens the muscles that move the hip joint away from the center of the body (hip abductors). Lie on your side with your left / right leg in the top position. Lie so your head, shoulder, hip, and knee line up. You may bend your bottom knee slightly to help you balance. Roll your hips slightly forward, so your hips are stacked directly over each other and your left / right knee is facing forward. Leading with your heel, lift your top leg 4-6 inches (10-15 cm). You should feel the muscles in your top hip lifting. Do not let your foot drift forward. Do not let your knee roll toward the ceiling. Hold this position for __________ seconds. Slowly return to the starting position. Let your muscles relax completely between repetitions. Repeat __________ times. Complete this exercise __________ times a day. Straight leg raises, side-lying This exercise strengthens the muscles that move the hip joint toward the center of the body (hip adductors). Lie on your side with your left / right leg in the bottom position. Lie so your head, shoulder, hip, and knee line up. You may place your  upper foot in front to help you balance. Roll your hips slightly forward, so your hips are stacked directly over each other and your left / right knee is facing forward. Tense the muscles in your inner thigh and lift your bottom leg 4-6 inches (10-15 cm). Hold this position for __________ seconds. Slowly return to the starting position. Let your muscles relax completely between repetitions. Repeat __________ times. Complete this exercise __________ times a day. Straight leg raises, supine This exercise strengthens the muscles in the front of your thigh (quadriceps and hip flexors). Lie on your back (supine position) with your left / right leg extended and your other knee bent. Tense the muscles in the front of your left / right thigh. You should see your kneecap slide up or see increased dimpling just above your knee. Keep these muscles tight as you raise your leg 4-6 inches (10-15 cm) off the floor. Do not let your knee bend. Hold this position for __________ seconds. Keep these muscles tense as you lower your leg. Relax the muscles slowly and completely between repetitions. Repeat __________ times. Complete this exercise __________ times a day. Hip abductors, standing This  exercise strengthens the muscles that move the leg and hip joint away from the center of the body (hip abductors). Tie one end of a rubber exercise band or tubing to a secure surface, such as a chair, table, or pole. Loop the other end of the band or tubing around your left / right ankle. Keeping your ankle with the band or tubing directly opposite the secured end, step away until there is tension in the tubing or band. Hold on to a chair, table, or pole as needed for balance. Lift your left / right leg out to your side. While you do this: Keep your back upright. Keep your shoulders over your hips. Keep your toes pointing forward. Make sure to use your hip muscles to slowly lift your leg. Do not tip your body or  forcefully lift your leg. Hold this position for __________ seconds. Slowly return to the starting position. Repeat __________ times. Complete this exercise __________ times a day. Squats This exercise strengthens the muscles in the front of your thigh (quadriceps). Stand in front of a table, or stand in a doorframe so your feet and knees are in line with the frame. You may place your hands on the table or frame for balance. Slowly bend your knees and lower your hips like you are going to sit in a chair. Keep your lower legs in a straight up-and-down position. Do not let your hips go lower than your knees. Do not bend your knees lower than told by your provider. If your hip pain increases, do not bend as low. Hold this position for ___________ seconds. Slowly push with your legs to return to standing. Do not use your hands to pull yourself to standing. Repeat __________ times. Complete this exercise __________ times a day. This information is not intended to replace advice given to you by your health care provider. Make sure you discuss any questions you have with your health care provider. Document Revised: 08/09/2022 Document Reviewed: 08/09/2022 Elsevier Patient Education  2024 [employer].

## 2024-03-15 NOTE — Assessment & Plan Note (Signed)
 Continue celebrex as prescribed by othopedics

## 2024-03-15 NOTE — Assessment & Plan Note (Signed)
 A1c today Encourage low-carb diet and exercise for weight loss

## 2024-03-15 NOTE — Assessment & Plan Note (Signed)
 Continue tylenol pm as needed

## 2024-03-15 NOTE — Progress Notes (Signed)
 Subjective:    Patient ID: Samantha Whitney, female    DOB: [birth year], 68 y.o.   MRN: [id number]  HPI  Pt presents to the clinic today for follow up of chronic conditions.   GERD: She is not sure what triggers this. She denies breakthrough on esomeprazole. There is no upper GI on file.  HLD: Her last LDL was 102, triglycerides 149, 09/2023.  She is not taking any cholesterol lowering medication at this time. She tries to consume a low fat diet  Prediabetes: Her last A1C was 5.5%, 09/2023. She is not taking any oral diabetic medication at this time. She does not check her sugars.  Chronic right hip pain: Managed with celebrex.  She follows with orthopedics.  Insomnia: She has difficulty falling asleep.  She takes tylenol pm as needed with some relief of symptoms.  There is no sleep study on file.   Review of Systems     Past Medical History:  Diagnosis Date   Allergy    Bronchitis    GERD (gastroesophageal reflux disease)     Current Outpatient Medications  Medication Sig Dispense Refill   albuterol (VENTOLIN HFA) 108 (90 Base) MCG/ACT inhaler INHALE 1 TO 2 PUFFS INTO THE LUNGS EVERY 6 HOURS AS NEEDED FOR SHORTNESS OF BREATH 18 g 1   azithromycin (ZITHROMAX) 250 MG tablet Take 2 tabs today, then 1 tab daily x 4 days 6 tablet 0   benzonatate (TESSALON) 200 MG capsule Take 1 capsule (200 mg total) by mouth 3 (three) times daily as needed. 30 capsule 0   Calcium Carbonate-Vitamin D (CALTRATE 600+D PO) Take 1 tablet by mouth daily.     CELEBREX 200 MG capsule Take 1 capsule every day by oral route with meal(s).     Cholecalciferol (VITAMIN D3 PO) Take by mouth.     clobetasol (OLUX) 0.05 % topical foam apply twice daily as needed to affected areas scalp/ears for two weeks then apply twice daily on weekends only, Avoid applying to face, groin, and axilla. Use as directed. Risk of skin atrophy with long-term use reviewed. 50 g 3   conjugated estrogens (PREMARIN) vaginal cream Use a  fingertip amount at opening of vagina nightly for vaginal dryness. 42.5 g 5   esomeprazole (NEXIUM) 40 MG capsule TAKE 1 CAPSULE(40 MG) BY MOUTH DAILY 90 capsule 0   ketoconazole (NIZORAL) 2 % shampoo Shampoo into the scalp let sit 5-10 minutes then wash out. Use 3 days per week. 120 mL 6   loratadine (CLARITIN) 10 MG tablet Take 10 mg by mouth daily.     Multiple Vitamin (MULTIVITAMIN WITH MINERALS) TABS tablet Take 1 tablet by mouth 4 (four) times a week.     SPIRIVA RESPIMAT 1.25 MCG/ACT AERS INHALE 2 PUFFS INTO THE LUNGS DAILY 4 g 2   WEGOVY 1.7 MG/0.75ML SOAJ      No current facility-administered medications for this visit.    Allergies  Allergen Reactions   Penicillins Hives   Scallops [Shellfish Allergy] Nausea And Vomiting and Rash    Family History  Problem Relation Age of Onset   Cancer Mother        ColoRectal   Cancer Father        Stomach Cancer    Social History   Socioeconomic History   Marital status: Married    Spouse name: Donah Driver   Number of children: 1   Years of education: Not on file   Highest education level: Bachelor's degree (e.g., BA, AB,  BS)  Occupational History    Employer: [employer]  Tobacco Use   Smoking status: Never   Smokeless tobacco: Never  Vaping Use   Vaping status: Never Used  Substance and Sexual Activity   Alcohol use: Yes    Alcohol/week: 2.0 standard drinks of alcohol    Types: 2 Glasses of wine per week    Comment: weekly   Drug use: Never   Sexual activity: Not on file  Other Topics Concern   Not on file  Social History Narrative   Not on file   Social Drivers of Health   Financial Resource Strain: Low Risk  (09/08/2023)   Overall Financial Resource Strain (CARDIA)    Difficulty of Paying Living Expenses: Not hard at all  Food Insecurity: No Food Insecurity (09/08/2023)   Hunger Vital Sign    Worried About Running Out of Food in the Last Year: Never true    Ran Out of Food in the Last Year: Never true   Transportation Needs: No Transportation Needs (09/08/2023)   PRAPARE - Administrator, Civil Service (Medical): No    Lack of Transportation (Non-Medical): No  Physical Activity: Sufficiently Active (09/08/2023)   Exercise Vital Sign    Days of Exercise per Week: 6 days    Minutes of Exercise per Session: 40 min  Stress: No Stress Concern Present (09/08/2023)   [employer] of Occupational Health - Occupational Stress Questionnaire    Feeling of Stress : Only a little  Social Connections: Unknown (09/08/2023)   Social Connection and Isolation Panel [NHANES]    Frequency of Communication with Friends and Family: Three times a week    Frequency of Social Gatherings with Friends and Family: More than three times a week    Attends Religious Services: Patient declined    Database administrator or Organizations: No    Attends Engineer, structural: Not on file    Marital Status: Married  Catering manager Violence: Not on file     Constitutional: Denies fever, malaise, fatigue, headache or abnormal weight gain.  HEENT: Denies eye pain, eye redness, ear pain, ringing in the ears, wax buildup, runny nose, nasal congestion, bloody nose, or sore throat. Respiratory: Denies difficulty breathing, shortness of breath, cough or sputum production.   Cardiovascular: Denies chest pain, chest tightness, palpitations or swelling in the hands or feet.  Gastrointestinal: Denies abdominal pain bloating, constipation, diarrhea or blood in the stool.  GU: Denies urgency, frequency, pain with urination, burning sensation, blood in urine, odor or discharge. Skin: Denies redness, rashes, lesions or ulcercations.  Musculoskeletal: Patient reports chronic right hip pain.  Denies difficulty with gait, decrease in range of motion or joint swelling. Neurological: Patient reports insomnia.  Denies dizziness, difficulty with memory, difficulty with speech or problems with balance and coordination.   Psych: Denies anxiety, depression, SI/HI.  No other specific complaints in a complete review of systems (except as listed in HPI above).  Objective:   Physical Exam There were no vitals taken for this visit.  Wt Readings from Last 3 Encounters:  09/08/23 183 lb (83 kg)  12/16/22 201 lb 9.6 oz (91.4 kg)  07/21/22 203 lb (92.1 kg)    General: Appears her stated age, obese, in NAD. Skin: Warm, dry and intact. HEENT: Head: normal shape and size; Eyes: EOMs intact;  Cardiovascular: Normal rate and rhythm. S1,S2 noted.  No murmur, rubs or gallops noted.  No carotid bruits noted. Pulmonary/Chest: Normal effort and positive vesicular  breath sounds. No respiratory distress. No wheezes, rales or ronchi noted.  Abdomen: Soft and nontender.  Hyperactive bowel sounds. No distention or masses noted. Musculoskeletal: No difficulty with gait.  Neurological: Alert and oriented.  Psychiatric: Mood and affect normal. Behavior is normal. Judgment and thought content normal.    BMET    Component Value Date/Time   NA 140 09/22/2023 0901   K 4.6 09/22/2023 0901   CL 102 09/22/2023 0901   CO2 22 09/22/2023 0901   GLUCOSE 92 09/22/2023 0901   BUN 14 09/22/2023 0901   CREATININE 0.82 09/22/2023 0901   CALCIUM 9.2 09/22/2023 0901   GFRNONAA 79 07/08/2020 0916   GFRAA 91 07/08/2020 0916    Lipid Panel     Component Value Date/Time   CHOL 191 09/22/2023 0901   TRIG 149 09/22/2023 0901   HDL 63 09/22/2023 0901   CHOLHDL 3.0 09/22/2023 0901   LDLCALC 102 (H) 09/22/2023 0901    CBC    Component Value Date/Time   WBC 5.9 09/22/2023 0901   RBC 4.26 09/22/2023 0901   HGB 12.8 09/22/2023 0901   HCT 39.9 09/22/2023 0901   PLT 293 09/22/2023 0901   MCV 94 09/22/2023 0901   MCH 30.0 09/22/2023 0901   MCHC 32.1 09/22/2023 0901   RDW 13.0 09/22/2023 0901   LYMPHSABS 1.9 02/18/2022 0939   EOSABS 0.2 02/18/2022 0939   BASOSABS 0.1 02/18/2022 0939    Hgb A1C Lab Results  Component Value  Date   HGBA1C 5.5 09/22/2023           Assessment & Plan:    RTC in 6 months for your annual exam  Nicki Reaper, NP

## 2024-03-15 NOTE — Assessment & Plan Note (Signed)
 Encourage weight loss as this can help reduce reflux symptoms Continue esomeprazole

## 2024-03-15 NOTE — Assessment & Plan Note (Signed)
 C-Met and lipid profile today Encouraged her to consume a low-fat diet

## 2024-03-29 ENCOUNTER — Encounter: Payer: Self-pay | Admitting: Internal Medicine

## 2024-03-29 DIAGNOSIS — E039 Hypothyroidism, unspecified: Secondary | ICD-10-CM

## 2024-03-29 LAB — COMPREHENSIVE METABOLIC PANEL WITH GFR
ALT: 12 IU/L (ref 0–32)
AST: 19 IU/L (ref 0–40)
Albumin: 4.3 g/dL (ref 3.9–4.9)
Alkaline Phosphatase: 63 IU/L (ref 44–121)
BUN/Creatinine Ratio: 14 (ref 12–28)
BUN: 12 mg/dL (ref 8–27)
Bilirubin Total: 0.3 mg/dL (ref 0.0–1.2)
CO2: 23 mmol/L (ref 20–29)
Calcium: 9.2 mg/dL (ref 8.7–10.3)
Chloride: 102 mmol/L (ref 96–106)
Creatinine, Ser: 0.86 mg/dL (ref 0.57–1.00)
Globulin, Total: 2.8 g/dL (ref 1.5–4.5)
Glucose: 95 mg/dL (ref 70–99)
Potassium: 4.5 mmol/L (ref 3.5–5.2)
Sodium: 140 mmol/L (ref 134–144)
Total Protein: 7.1 g/dL (ref 6.0–8.5)
eGFR: 73 mL/min/{1.73_m2} (ref >59)

## 2024-03-29 LAB — CBC
Hematocrit: 37.5 % (ref 34.0–46.6)
Hemoglobin: 12.6 g/dL (ref 11.1–15.9)
MCH: 30.6 pg (ref 26.6–33.0)
MCHC: 33.6 g/dL (ref 31.5–35.7)
MCV: 91 fL (ref 79–97)
Platelets: 281 10*3/uL (ref 150–450)
RBC: 4.12 x10E6/uL (ref 3.77–5.28)
RDW: 12.2 % (ref 11.7–15.4)
WBC: 4.4 10*3/uL (ref 3.4–10.8)

## 2024-03-29 LAB — LIPID PANEL
Chol/HDL Ratio: 2.9 ratio (ref 0.0–4.4)
Cholesterol, Total: 197 mg/dL (ref 100–199)
HDL: 68 mg/dL (ref >39)
LDL Chol Calc (NIH): 108 mg/dL — ABNORMAL HIGH (ref 0–99)
Triglycerides: 120 mg/dL (ref 0–149)
VLDL Cholesterol Cal: 21 mg/dL (ref 5–40)

## 2024-03-29 LAB — HEMOGLOBIN A1C
Est. average glucose Bld gHb Est-mCnc: 103 mg/dL
Hgb A1c MFr Bld: 5.2 % (ref 4.8–5.6)

## 2024-03-29 LAB — TSH: TSH: 5.34 u[IU]/mL — ABNORMAL HIGH (ref 0.450–4.500)

## 2024-03-29 MED ORDER — LEVOTHYROXINE SODIUM 25 MCG PO TABS
25.0000 ug | ORAL_TABLET | Freq: Every day | ORAL | 1 refills | Status: DC
Start: 2024-03-29 — End: 2024-09-13

## 2024-04-09 ENCOUNTER — Ambulatory Visit: Payer: Self-pay | Admitting: *Deleted

## 2024-04-09 ENCOUNTER — Ambulatory Visit: Admitting: Internal Medicine

## 2024-04-09 ENCOUNTER — Encounter: Payer: Self-pay | Admitting: Internal Medicine

## 2024-04-09 ENCOUNTER — Telehealth: Payer: Self-pay | Admitting: Internal Medicine

## 2024-04-09 VITALS — BP 108/70 | Temp 97.4°F | Ht 64.0 in | Wt 169.6 lb

## 2024-04-09 DIAGNOSIS — J029 Acute pharyngitis, unspecified: Secondary | ICD-10-CM | POA: Diagnosis not present

## 2024-04-09 DIAGNOSIS — K121 Other forms of stomatitis: Secondary | ICD-10-CM | POA: Diagnosis not present

## 2024-04-09 LAB — POCT RAPID STREP A (OFFICE): Rapid Strep A Screen: NEGATIVE

## 2024-04-09 MED ORDER — MAGIC MOUTHWASH W/LIDOCAINE: 5.0000 mL | Freq: Four times a day (QID) | ORAL | 0 refills | Status: DC | PRN

## 2024-04-09 NOTE — Telephone Encounter (Signed)
Will discuss at upcoming appointment today 

## 2024-04-09 NOTE — Patient Instructions (Signed)
 Oral Ulcers Oral ulcers are small sores inside the mouth or near the mouth. They may occur on or inside the lips, inside the cheeks, on the tongue, or anywhere else inside or near the mouth. They may be called canker sores or cold sores, which are two types of oral ulcers. Many oral ulcers are harmless and go away on their own. In some cases, oral ulcers may require medical care to determine the cause and proper treatment. What are the causes? Common causes of this condition include: Infections caused by viruses, bacteria, or fungi. Emotional stress. Foods or chemicals that irritate the mouth. Injury or physical irritation of the mouth. Medicines. Allergies. Tobacco use. Less common causes include: Skin disease. A type of herpes virus infection (herpes simplex or herpes zoster). Oral cancer. In some cases, the cause may not be known. What increases the risk? You are more likely to develop this condition if: You wear dental braces, dentures, or retainers. You do not take good care of your mouth and teeth (poor oral hygiene). You have sensitive skin. You have a condition that affects the entire body (systemic condition), such as an immune disorder. What are the signs or symptoms? The main symptom of this condition is having one or more oval-shaped or round ulcers that have red borders. Symptoms may vary depending on the cause. This includes: Location of the ulcers. Ulcers may be found inside the mouth, on the gums, or on the insides of the lips or cheeks. They may also be found on the lips or on skin that is near the mouth, such as the cheeks or chin. Pain. Ulcers can be painful and uncomfortable, or they can be painless. Appearance of the ulcers. They may look like red blisters and be filled with fluid, or they may be white or yellow patches. Frequency of outbreaks. Ulcers may go away permanently after one outbreak, or they may come back (recur) often or rarely. How is this  diagnosed? This condition is diagnosed with a physical exam. Your health care provider may ask you questions about your lifestyle and your medical history. You may have tests, including: Blood tests. Removal of a small number of cells from an ulcer to be examined under a microscope (biopsy). How is this treated? Treatment depends on the severity and cause of the condition. Oral ulcers often go away on their own in 1-2 weeks. Treatment may include medicines, such as: Medicines to treat a viral infection (antivirals), a bacterial infection (antibiotics), or a fungal infection (antifungals). Medicines to help control pain. This may include: Over-the-counter pain medicines. Gel, cream, or spray to numb the area (topical anesthetic) if you have severe pain. Other medicines to coat or numb your mouth. Follow these instructions at home: Medicines Take or use over-the-counter and prescription medicines only as told by your health care provider. If you were prescribed an antibiotic medicine, take it as told by your health care provider. Do not stop taking the antibiotic even if you start to feel better. Do not use products that contain benzocaine (including numbing gels) to treat teething or mouth pain in children who are younger than 2 years. These products may cause a rare but serious blood condition. Eating and drinking Eat a balanced diet. Do not eat: Spicy foods. Citrus, such as oranges. Other foods and drinks that you think may cause or irritate your ulcers. Drink enough fluid to keep your urine pale yellow. Avoid drinking alcohol. Lifestyle  Practice good oral hygiene: Gently brush your teeth  with a soft toothbrush two times a day. Floss your teeth every day. Get regular dental cleanings and checkups. Do not use any products that contain nicotine or tobacco. These products include cigarettes, chewing tobacco, and vaping devices, such as e-cigarettes. If you need help quitting, ask your  health care provider. Managing pain If directed, put ice on your face in the affected area to help reduce pain. To do this: Put ice in a plastic bag. Place a towel between your skin and the bag. Leave the ice on for 20 minutes, 2-3 times a day. Remove the ice if your skin turns bright red. This is very important. If you cannot feel pain, heat, or cold, you have a greater risk of damage to the area. Avoid physical or chemical irritants that may have caused the ulcers or made them worse, such as mouthwashes that contain alcohol (ethanol). If you wear dental braces, dentures, or retainers, work with your health care provider to make sure these devices are fitted correctly. If you were prescribed a prescription mouthwash to help reduce pain in your mouth, use it as told by your health care provider. General instructions Rinse your mouth with a mixture of salt and water 3-4 times a day or as told by your health care provider. To make salt water, completely dissolve -1 tsp (3-6 g) of salt in 1 cup (237 mL) of warm water. Keep all follow-up visits. This is important. Contact a health care provider if: You have: Pain that gets worse or does not get better with medicine. Four or more ulcers at one time. A fever. New ulcers that look or feel different from other ulcers you have. Inflammation in one eye or both eyes. Ulcers that do not go away after 10 days. You develop new symptoms in your mouth, such as: Bleeding or crusting around your lips or gums. Tooth pain. Difficulty swallowing. You develop symptoms on your skin or genitals, such as: A rash or blisters. Burning or itching sensations. Your ulcers begin or get worse after you start a new medicine. Get help right away if: You have difficulty breathing. You have swelling in your face or neck. You have excessive bleeding from your mouth. You have severe pain. Summary Oral ulcers may occur anywhere inside or near the mouth. They can be  caused by many things, such as infections, stress, injury or irritation, or tobacco use. Oral ulcers can be painful or painless. Treatment may include medicines to relieve pain or to treat an infection (if appropriate). Most oral ulcers go away in 1-2 weeks. This information is not intended to replace advice given to you by your health care provider. Make sure you discuss any questions you have with your health care provider. Document Revised: 09/15/2021 Document Reviewed: 09/15/2021 Elsevier Patient Education  2024 [employer].

## 2024-04-09 NOTE — Telephone Encounter (Signed)
  Chief Complaint: sore throat, pain with swallowing , left ear pressure Symptoms: see above Frequency: 5 days Pertinent Negatives: Patient denies chest pain no difficulty breathing, no fever. Disposition: [] ED /[] Urgent Care (no appt availability in office) / [x] Appointment(In office/virtual)/ []  [facility]/ [] Home Care/ [] Refused Recommended Disposition /[] [place] [facility]/ []  Follow-up with PCP Additional Notes:   Appt scheduled today . Patient requesting strep test.      Copied from CRM [id number]. Topic: Clinical - Red Word Triage >> Apr 09, 2024  8:17 AM Everette C wrote: [facility] that prompted transfer to Nurse Triage: The patient has experienced an increasingly sore throat for roughly 5 days, the patient has now began to experience discomfort and difficulty swallowing Reason for Disposition  Earache also present  Answer Assessment - Initial Assessment Questions 1. ONSET: "When did the throat start hurting?" (Hours or days ago)      5 days ago  2. SEVERITY: "How bad is the sore throat?" (Scale 1-10; mild, moderate or severe)   - MILD (1-3):  Doesn't interfere with eating or normal activities.   - MODERATE (4-7): Interferes with eating some solids and normal activities.   - SEVERE (8-10):  Excruciating pain, interferes with most normal activities.   - SEVERE WITH DYSPHAGIA (10): Can't swallow liquids, drooling.     Moderate  3. STREP EXPOSURE: "Has there been any exposure to strep within the past week?" If Yes, ask: "What type of contact occurred?"      No  4.  VIRAL SYMPTOMS: "Are there any symptoms of a cold, such as a runny nose, cough, hoarse voice or red eyes?"      Left ear pressure 5. FEVER: "Do you have a fever?" If Yes, ask: "What is your temperature, how was it measured, and when did it start?"     na 6. PUS ON THE TONSILS: "Is there pus on the tonsils in the back of your throat?"     No  7. OTHER SYMPTOMS: "Do you have any other symptoms?" (e.g.,  difficulty breathing, headache, rash)     Sore throat pain with swallowing 8. PREGNANCY: "Is there any chance you are pregnant?" "When was your last menstrual period?"     na  Protocols used: Sore Throat-A-AH

## 2024-04-09 NOTE — Telephone Encounter (Signed)
 Copied from CRM [id number]. Topic: Clinical - Prescription Issue >> Apr 09, 2024 11:42 AM Rennis Case wrote: Reason for CRM: Kaitlin w/ [facility] calling to advise they no longer compound the magic mouthwash w/lidocaine  SOLN.   Calling to inform so that rx may be sent elsewhere

## 2024-04-09 NOTE — Progress Notes (Signed)
 Subjective:    Patient ID: Samantha Whitney, female    DOB: Feb 15, 1956, 68 y.o.   MRN: [id number]  HPI  Discussed the use of AI scribe software for clinical note transcription with the patient, who gave verbal consent to proceed.  Samantha Whitney is a 68 year old female who presents with a sore throat and concern for possible strep infection.  She has experienced a significantly sore throat since Wednesday or Thursday of last week, with progressive worsening each day. The pain is exacerbated by swallowing, drinking, and eating.   No runny nose, nasal congestion, ear pain, drainage, or cough. She has a slight headache but has not required Tylenol . She feels some discomfort in one ear but no significant pain. No fever or rashes have been observed.  She recalls a past severe episode of strep throat when her son was young, which included a high fever and delirium, but she has not experienced similar symptoms this time. There is no known recent exposure to strep throat from others.   Review of Systems   Past Medical History:  Diagnosis Date   Allergy    Bronchitis    GERD (gastroesophageal reflux disease)     Current Outpatient Medications  Medication Sig Dispense Refill   Calcium Carbonate-Vitamin D  (CALTRATE 600+D PO) Take 1 tablet by mouth daily.     CELEBREX 200 MG capsule Take 1 capsule every day by oral route with meal(s).     Cholecalciferol (VITAMIN D3 PO) Take by mouth.     clobetasol  (OLUX ) 0.05 % topical foam apply twice daily as needed to affected areas scalp/ears for two weeks then apply twice daily on weekends only, Avoid applying to face, groin, and axilla. Use as directed. Risk of skin atrophy with long-term use reviewed. 50 g 3   conjugated estrogens  (PREMARIN ) vaginal cream Use a fingertip amount at opening of vagina nightly for vaginal dryness. 42.5 g 5   esomeprazole  (NEXIUM ) 40 MG capsule TAKE 1 CAPSULE(40 MG) BY MOUTH DAILY 90 capsule 1   ketoconazole  (NIZORAL ) 2  % shampoo Shampoo into the scalp let sit 5-10 minutes then wash out. Use 3 days per week. 120 mL 6   levothyroxine  (SYNTHROID ) 25 MCG tablet Take 1 tablet (25 mcg total) by mouth daily. 90 tablet 1   loratadine (CLARITIN) 10 MG tablet Take 10 mg by mouth daily.     Multiple Vitamin (MULTIVITAMIN WITH MINERALS) TABS tablet Take 1 tablet by mouth 4 (four) times a week.     WEGOVY 1.7 MG/0.75ML SOAJ      No current facility-administered medications for this visit.    Allergies  Allergen Reactions   Penicillins Hives   Scallops [Shellfish Allergy] Nausea And Vomiting and Rash    Family History  Problem Relation Age of Onset   Cancer Mother        ColoRectal   Cancer Father        Stomach Cancer    Social History   Socioeconomic History   Marital status: Married    Spouse name: Shermon Divine   Number of children: 1   Years of education: Not on file   Highest education level: Bachelor's degree (e.g., BA, AB, BS)  Occupational History    Employer: [employer]  Tobacco Use   Smoking status: Never   Smokeless tobacco: Never  Vaping Use   Vaping status: Never Used  Substance and Sexual Activity   Alcohol use: Yes    Alcohol/week: 2.0 standard drinks of alcohol  Types: 2 Glasses of wine per week    Comment: weekly   Drug use: Never   Sexual activity: Not on file  Other Topics Concern   Not on file  Social History Narrative   Not on file   Social Drivers of Health   Financial Resource Strain: Low Risk  (09/08/2023)   Overall Financial Resource Strain (CARDIA)    Difficulty of Paying Living Expenses: Not hard at all  Food Insecurity: No Food Insecurity (09/08/2023)   Hunger Vital Sign    Worried About Running Out of Food in the Last Year: Never true    Ran Out of Food in the Last Year: Never true  Transportation Needs: No Transportation Needs (09/08/2023)   PRAPARE - Administrator, Civil Service (Medical): No    Lack of Transportation (Non-Medical): No  Physical  Activity: Sufficiently Active (09/08/2023)   Exercise Vital Sign    Days of Exercise per Week: 6 days    Minutes of Exercise per Session: 40 min  Stress: No Stress Concern Present (09/08/2023)   [employer] of Occupational Health - Occupational Stress Questionnaire    Feeling of Stress : Only a little  Social Connections: Unknown (09/08/2023)   Social Connection and Isolation Panel [NHANES]    Frequency of Communication with Friends and Family: Three times a week    Frequency of Social Gatherings with Friends and Family: More than three times a week    Attends Religious Services: Patient declined    Database administrator or Organizations: No    Attends Engineer, structural: Not on file    Marital Status: Married  Catering manager Violence: Not on file     Constitutional: Pt reports headache. Denies fever, malaise, fatigue, or abrupt weight changes.  HEENT: Patient reports left ear fullness, sore throat.  Denies eye pain, eye redness, ringing in the ears, wax buildup, runny nose, nasal congestion, bloody nose. Respiratory: Denies difficulty breathing, shortness of breath, cough or sputum production.   Cardiovascular: Denies chest pain, chest tightness, palpitations or swelling in the hands or feet.  Gastrointestinal: Pt reports abdominal pain. Denies bloating, constipation, diarrhea or blood in the stool.  Skin: Denies redness, rashes, lesions or ulcercations.   No other specific complaints in a complete review of systems (except as listed in HPI above).      Objective:   Physical Exam  BP 108/70 (BP Location: Left Arm, Patient Position: Sitting, Cuff Size: Normal)   Temp (!) 97.4 F (36.3 C)   Ht 5\' 4"  (1.626 m)   Wt 169 lb 9.6 oz (76.9 kg)   BMI 29.11 kg/m   Wt Readings from Last 3 Encounters:  03/15/24 173 lb (78.5 kg)  09/08/23 183 lb (83 kg)  12/16/22 201 lb 9.6 oz (91.4 kg)    General: Appears her stated age, overweight, in NAD. Skin: Warm, dry and  intact. No rashes noted. HEENT: Head: normal shape and size; Eyes: sclera white, no icterus, conjunctiva pink, PERRLA and EOMs intact; Ears: Tm's gray and intact, normal light reflex;  Throat/Mouth: Teeth present, mucosa erythematous and moist, ulceration noted of the soft palate in the right posterior arch.  Neck: No adenopathy noted. Cardiovascular: Normal rate. Pulmonary/Chest: Normal effort. Neurological: Alert and oriented.   BMET    Component Value Date/Time   NA 140 03/28/2024 0839   K 4.5 03/28/2024 0839   CL 102 03/28/2024 0839   CO2 23 03/28/2024 0839   GLUCOSE 95 03/28/2024 0839  BUN 12 03/28/2024 0839   CREATININE 0.86 03/28/2024 0839   CALCIUM 9.2 03/28/2024 0839   GFRNONAA 79 07/08/2020 0916   GFRAA 91 07/08/2020 0916    Lipid Panel     Component Value Date/Time   CHOL 197 03/28/2024 0839   TRIG 120 03/28/2024 0839   HDL 68 03/28/2024 0839   CHOLHDL 2.9 03/28/2024 0839   LDLCALC 108 (H) 03/28/2024 0839    CBC    Component Value Date/Time   WBC 4.4 03/28/2024 0839   RBC 4.12 03/28/2024 0839   HGB 12.6 03/28/2024 0839   HCT 37.5 03/28/2024 0839   PLT 281 03/28/2024 0839   MCV 91 03/28/2024 0839   MCH 30.6 03/28/2024 0839   MCHC 33.6 03/28/2024 0839   RDW 12.2 03/28/2024 0839   LYMPHSABS 1.9 02/18/2022 0939   EOSABS 0.2 02/18/2022 0939   BASOSABS 0.1 02/18/2022 0939    Hgb A1C Lab Results  Component Value Date   HGBA1C 5.2 03/28/2024            Assessment & Plan:  Assessment and Plan    Oral ulceration Large ulcer on the soft palate, likely viral or related to acid reflux. Strep test negative. - Prescribed magic mouthwash qid for 5 days. - Advised salt water gargles, avoid use immediately after mouthwash.   RTC in 5 months for your annual exam Helayne Lo, NP

## 2024-04-09 NOTE — Telephone Encounter (Signed)
 Most pharmacies do compound it. You can call and see if she wants it sent somewhere else

## 2024-06-11 ENCOUNTER — Telehealth: Payer: Self-pay

## 2024-06-11 NOTE — Telephone Encounter (Signed)
 Copied from CRM [id number]. Topic: Clinical - Request for Lab/Test Order >> Jun 10, 2024  4:57 PM Delon HERO wrote: Reason for CRM: [facility] is calling to request paper order for TSH and Free T4. Patient is reporting that she will pick up.

## 2024-06-15 LAB — T4, FREE: Free T4: 1.24 ng/dL (ref 0.82–1.77)

## 2024-06-15 LAB — TSH: TSH: 2.1 u[IU]/mL (ref 0.450–4.500)

## 2024-06-15 LAB — T3: T3, Total: 97 ng/dL (ref 71–180)

## 2024-06-17 ENCOUNTER — Ambulatory Visit: Payer: Self-pay | Admitting: Internal Medicine

## 2024-07-25 ENCOUNTER — Ambulatory Visit: Payer: Managed Care, Other (non HMO) | Admitting: Dermatology

## 2024-07-25 ENCOUNTER — Encounter: Payer: Self-pay | Admitting: Dermatology

## 2024-07-25 DIAGNOSIS — L821 Other seborrheic keratosis: Secondary | ICD-10-CM | POA: Diagnosis not present

## 2024-07-25 DIAGNOSIS — L570 Actinic keratosis: Secondary | ICD-10-CM

## 2024-07-25 DIAGNOSIS — W908XXA Exposure to other nonionizing radiation, initial encounter: Secondary | ICD-10-CM | POA: Diagnosis not present

## 2024-07-25 DIAGNOSIS — Z1283 Encounter for screening for malignant neoplasm of skin: Secondary | ICD-10-CM

## 2024-07-25 DIAGNOSIS — L814 Other melanin hyperpigmentation: Secondary | ICD-10-CM

## 2024-07-25 DIAGNOSIS — D1801 Hemangioma of skin and subcutaneous tissue: Secondary | ICD-10-CM

## 2024-07-25 DIAGNOSIS — L219 Seborrheic dermatitis, unspecified: Secondary | ICD-10-CM

## 2024-07-25 DIAGNOSIS — D229 Melanocytic nevi, unspecified: Secondary | ICD-10-CM

## 2024-07-25 DIAGNOSIS — L578 Other skin changes due to chronic exposure to nonionizing radiation: Secondary | ICD-10-CM

## 2024-07-25 DIAGNOSIS — I781 Nevus, non-neoplastic: Secondary | ICD-10-CM

## 2024-07-25 MED ORDER — CLOBETASOL PROPIONATE 0.05 % EX FOAM: CUTANEOUS | 6 refills | Status: AC

## 2024-07-25 MED ORDER — CLOBETASOL PROPIONATE 0.05 % EX FOAM: CUTANEOUS | 3 refills | Status: DC

## 2024-07-25 NOTE — Addendum Note (Signed)
 Addended by: CLAUDENE LEHMANN on: 07/25/2024 09:31 AM   Modules accepted: Orders

## 2024-07-25 NOTE — Patient Instructions (Addendum)
 Cryotherapy  Cryotherapy is the treatment of lesions with the application of a cold substance.  In most cases, liquid nitrogen is used to destroy the lesion(s).  Liquid nitrogen is so cold, -196 Celsius, it feels like it is burning when it is applied.  After treatment with liquid nitrogen, there may be some burning sensation or pain that can last up to 24 hours.  The area may also be swollen and red.  The discomfort can be relieved with ibuprofen (Advil, Motrin), acetaminophen  (Extra Strength Tylenol ), or similar pain relief medication.  Within 24 to 48 hours, a blister may form.  Occasionally, these blisters will become filled with blood and become very dark.  This is no cause for concern.  The blister will gradually dry up over a period of several days, eventually separating from the healing skin below in about one to two weeks.  The surrounding skin will become red and the area may become itchy.  This is all part of the normal healing process.  Occasionally, the crusts will last as long as four weeks when certain deeper spots on the skin are treated.  Sometimes a permanent white mark or scar will be left after healing.  You may continue all of your normal activities as long as they do not cause pain in the treated areas.  It is okay to get the area wet.  After therapy or if the blister is still intact - You may treat it like normal skin.  Covering the area with a bandage may offer some comfort and protection from trauma but is not absolutely necessary.  If the blister is uncomfortable - Clean a small needle with rubbing alcohol, then gently make a small hole in the side of the blister to drain the blister fluid.  This often gives immediate relief.  Do not remove the blister roof, as the blister aids in healing.  In time it will fall off on its own.   Once the blister roof falls off or a sore forms -  Clean the blister sites with soap and water.  Rinse the area and pat dry.  Do not force off the  blister roof or crust. Apply an antibiotic ointment such as Polysporin or Bacitracin. A bandage may be applied loosely over the blister until it is healed if desired. Call our office if you are concerned it may be infected.  Some redness, itching and oozing is part of the normal healing process.  Signs of infection include increasing redness, increasing pain, swelling, heat, or yellow discharge.  Due to recent changes in healthcare laws, you may see results of your pathology and/or laboratory studies on MyChart before the doctors have had a chance to review them. We understand that in some cases there may be results that are confusing or concerning to you. Please understand that not all results are received at the same time and often the doctors may need to interpret multiple results in order to provide you with the best plan of care or course of treatment. Therefore, we ask that you please give us  2 business days to thoroughly review all your results before contacting the office for clarification. Should we see a critical lab result, you will be contacted sooner.    If You Need Anything After Your Visit  If you have any questions or concerns for your doctor, please call our main line at [phone] and press option 4 to reach your doctor's medical assistant. If no one answers, please leave a  voicemail as directed and we will return your call as soon as possible. Messages left after 4 pm will be answered the following business day.   You may also send us  a message via MyChart. We typically respond to MyChart messages within 1-2 business days.  For prescription refills, please ask your pharmacy to contact our office. Our fax number is [phone].  If you have an urgent issue when the clinic is closed that cannot wait until the next business day, you can page your doctor at the number below.    Please note that while we do our best to be available for urgent issues outside of office hours, we are  not available 24/7.   If you have an urgent issue and are unable to reach us , you may choose to seek medical care at your doctor's office, retail clinic, urgent care center, or emergency room.  If you have a medical emergency, please immediately call 911 or go to the emergency department.  Pager Numbers  - Dr. Hester: [phone]  - Dr. Jackquline: [phone]  - Dr. Claudene: [phone]   In the event of inclement weather, please call our main line at [phone] for an update on the status of any delays or closures.  Dermatology Medication Tips: Please keep the boxes that topical medications come in in order to help keep track of the instructions about where and how to use these. Pharmacies typically print the medication instructions only on the boxes and not directly on the medication tubes.   If your medication is too expensive, please contact our office at [phone] option 4 or send us  a message through MyChart.   We are unable to tell what your co-pay for medications will be in advance as this is different depending on your insurance coverage. However, we may be able to find a substitute medication at lower cost or fill out paperwork to get insurance to cover a needed medication.   If a prior authorization is required to get your medication covered by your insurance company, please allow us  1-2 business days to complete this process.  Drug prices often vary depending on where the prescription is filled and some pharmacies may offer cheaper prices.  The website www.goodrx.com contains coupons for medications through different pharmacies. The prices here do not account for what the cost may be with help from insurance (it may be cheaper with your insurance), but the website can give you the price if you did not use any insurance.  - You can print the associated coupon and take it with your prescription to the pharmacy.  - You may also stop by our office during regular business  hours and pick up a GoodRx coupon card.  - If you need your prescription sent electronically to a different pharmacy, notify our office through [facility] or by phone at [phone] option 4.     Si Usted Necesita Algo Despus de Su Visita  Tambin puede enviarnos un mensaje a travs de Clinical cytogeneticist. Por lo general respondemos a los mensajes de MyChart en el transcurso de 1 a 2 das hbiles.  Para renovar recetas, por favor pida a su farmacia que se ponga en contacto con nuestra oficina. Randi lakes de fax es [place] [phone].  Si tiene un asunto urgente cuando la clnica est cerrada y que no puede esperar hasta el siguiente da hbil, puede llamar/localizar a su doctor(a) al nmero que aparece a continuacin.   Por favor, tenga en cuenta que aunque hacemos todo  lo posible para estar disponibles para asuntos urgentes fuera del horario de oficina, no estamos disponibles las 24 horas del da, los 7 [address]  [address] de la [place].   Si tiene un problema urgente y no puede comunicarse con nosotros, puede optar por buscar atencin mdica  en el consultorio de su doctor(a), en una clnica privada, en un centro de atencin urgente o en una sala de emergencias.  Si tiene Engineer, drilling, por favor llame inmediatamente al 911 o vaya a la sala de emergencias.  Nmeros de bper  - Dr. Hester: [phone]  - Dra. Jackquline: [phone]  - Dr. Claudene: [phone]   En caso de inclemencias del tiempo, por favor llame a landry capes principal al [phone] para una actualizacin sobre el [place] de cualquier retraso o cierre.  Consejos para la medicacin en dermatologa: Por favor, guarde las cajas en las que vienen los medicamentos de uso tpico para ayudarle a seguir las instrucciones sobre dnde y cmo usarlos. Las farmacias generalmente imprimen las instrucciones del medicamento slo en las cajas y no directamente en los tubos del [place].   Si su medicamento es muy caro, por favor,  pngase en contacto con landry rieger llamando al [phone] y presione la opcin 4 o envenos un mensaje a travs de Clinical cytogeneticist.   No podemos decirle cul ser su copago por los medicamentos por adelantado ya que esto es diferente dependiendo de la cobertura de su seguro. Sin embargo, es posible que podamos encontrar un medicamento sustituto a Audiological scientist un formulario para que el seguro cubra el medicamento que se considera necesario.   Si se requiere una autorizacin previa para que su compaa de seguros malta su medicamento, por favor permtanos de 1 a 2 das hbiles para completar este proceso.  Los precios de los medicamentos varan con frecuencia dependiendo del Environmental consultant de dnde se surte la receta y alguna farmacias pueden ofrecer precios ms baratos.  El sitio web www.goodrx.com tiene cupones para medicamentos de Health and safety inspector. Los precios aqu no tienen en cuenta lo que podra costar con la ayuda del seguro (puede ser ms barato con su seguro), pero el sitio web puede darle el precio si no utiliz Tourist information centre manager.  - Puede imprimir el cupn correspondiente y llevarlo con su receta a la farmacia.  - Tambin puede pasar por nuestra oficina durante el horario de atencin regular y Education officer, museum una tarjeta de cupones de GoodRx.  - Si necesita que su receta se enve electrnicamente a una farmacia diferente, informe a nuestra oficina a travs de MyChart de [place] o por telfono llamando al [phone] y presione la opcin 4.

## 2024-07-25 NOTE — Progress Notes (Addendum)
 Follow-Up Visit   Subjective  Samantha Whitney is a 68 y.o. female who presents for the following: Skin Cancer Screening and Full Body Skin Exam. Patient repots a couple of spots on her let leg that she is concerned about.   The patient presents for Total-Body Skin Exam (TBSE) for skin cancer screening and mole check. The patient has spots, moles and lesions to be evaluated, some may be new or changing and the patient may have concern these could be cancer.    The following portions of the chart were reviewed this encounter and updated as appropriate: medications, allergies, medical history  Review of Systems:  No other skin or systemic complaints except as noted in HPI or Assessment and Plan.  Objective  Well appearing patient in no apparent distress; mood and affect are within normal limits.  A full examination was performed including scalp, head, eyes, ears, nose, lips, neck, chest, axillae, abdomen, back, buttocks, bilateral upper extremities, bilateral lower extremities, hands, feet, fingers, toes, fingernails, and toenails. All findings within normal limits unless otherwise noted below.   Relevant physical exam findings are noted in the Assessment and Plan.  Exam of nails limited by presence of nail polish.   R nasal tip x1, central nasal tip x1 (2) Erythematous scaly lesion.   Assessment & Plan   SKIN CANCER SCREENING PERFORMED TODAY.  ACTINIC DAMAGE - Chronic condition, secondary to cumulative UV/sun exposure - diffuse scaly erythematous macules with underlying dyspigmentation - Recommend daily broad spectrum sunscreen SPF 30+ to sun-exposed areas, reapply every 2 hours as needed.  - Staying in the shade or wearing long sleeves, sun glasses (UVA+UVB protection) and wide brim hats (4-inch brim around the entire circumference of the hat) are also recommended for sun protection.  - Call for new or changing lesions.  LENTIGINES, SEBORRHEIC KERATOSES, HEMANGIOMAS - Benign  normal skin lesions - Benign-appearing - Call for any changes  MELANOCYTIC NEVI - Tan-brown and/or pink-flesh-colored symmetric macules and papules - Benign appearing on exam today - Observation - Call clinic for new or changing moles - Recommend daily use of broad spectrum spf 30+ sunscreen to sun-exposed areas.    Varicose Veins/Spider Veins - Dilated blue, purple or red veins at the lower extremities - Reassured - Smaller vessels can be treated by sclerotherapy (a procedure to inject a medicine into the veins to make them disappear) if desired, but the treatment is not covered by insurance. Larger vessels may be covered if symptomatic and we would refer to vascular surgeon if treatment desired.  SEBORRHEIC KERATOSIS on bilateral legs - Stuck-on, waxy, tan-brown papules and/or plaques  - Benign-appearing - Discussed benign etiology and prognosis. - Observe - Call for any changes   ACTINIC KERATOSIS (2) R nasal tip x1, central nasal tip x1 (2) Destruction of lesion - R nasal tip x1, central nasal tip x1 (2) Complexity: simple   Destruction method: cryotherapy   Informed consent: discussed and consent obtained   Timeout:  patient name, date of birth, surgical site, and procedure verified Lesion destroyed using liquid nitrogen: Yes   Region frozen until ice ball extended beyond lesion: Yes   Cryo cycles: 1 or 2. Outcome: patient tolerated procedure well with no complications   Post-procedure details: wound care instructions given   Additional details:  Prior to procedure, discussed risks of blister formation, small wound, skin dyspigmentation, or rare scar following cryotherapy. Recommend Vaseline ointment to treated areas while healing.   SEBORRHEIC DERMATITIS   Related Medications ketoconazole  (NIZORAL )  2 % shampoo Shampoo into the scalp let sit 5-10 minutes then wash out. Use 3 days per week. clobetasol  (OLUX ) 0.05 % topical foam apply twice daily as needed to affected  areas scalp/ears for two weeks then apply twice daily on weekends only, Avoid applying to face, groin, and axilla. Use as directed. Risk of skin atrophy with long-term use reviewed. MULTIPLE BENIGN NEVI   ACTINIC ELASTOSIS   SEBORRHEIC KERATOSES   CHERRY ANGIOMA   LENTIGINES   Return in about 1 year (around 07/25/2025) for TBSE.  I, Emerick Ege, CMA am acting as scribe for Boneta Sharps, MD.   Documentation: I have reviewed the above documentation for accuracy and completeness, and I agree with the above.  Boneta Sharps, MD

## 2024-09-02 ENCOUNTER — Inpatient Hospital Stay: Admission: RE | Admit: 2024-09-02 | Source: Ambulatory Visit

## 2024-09-02 ENCOUNTER — Ambulatory Visit
Admission: RE | Admit: 2024-09-02 | Discharge: 2024-09-02 | Disposition: A | Discharge status: Home / Self Care | Source: Ambulatory Visit | Attending: Internal Medicine | Admitting: Internal Medicine

## 2024-09-02 DIAGNOSIS — Z1231 Encounter for screening mammogram for malignant neoplasm of breast: Secondary | ICD-10-CM | POA: Insufficient documentation

## 2024-09-11 ENCOUNTER — Other Ambulatory Visit: Payer: Self-pay | Admitting: Internal Medicine

## 2024-09-11 DIAGNOSIS — E039 Hypothyroidism, unspecified: Secondary | ICD-10-CM

## 2024-09-13 NOTE — Telephone Encounter (Signed)
 Requested Prescriptions  Pending Prescriptions Disp Refills   levothyroxine  (SYNTHROID ) 25 MCG tablet [Pharmacy Med Name: LEVOTHYROXINE  0.025MG  ( ) TAB] 90 tablet 2    Sig: TAKE 1 TABLET(25 MCG) BY MOUTH DAILY     Endocrinology:  Hypothyroid Agents Passed - 09/13/2024  9:41 AM      Passed - TSH in normal range and within 360 days    TSH  Date Value Ref Range Status  06/14/2024 2.100 0.450 - 4.500 uIU/mL Final         Passed - Valid encounter within last 12 months    Recent Outpatient Visits           5 months ago Oral ulceration   [address] [place], [place] W, NP   6 months ago Gastroesophageal reflux disease without esophagitis   [place] [facility] [place], Angeline ORN, NP       Future Appointments             In 10 months Claudene Lehmann, MD [facility]

## 2024-09-18 ENCOUNTER — Ambulatory Visit (INDEPENDENT_AMBULATORY_CARE_PROVIDER_SITE_OTHER): Admitting: Internal Medicine

## 2024-09-18 ENCOUNTER — Encounter: Payer: Self-pay | Admitting: Internal Medicine

## 2024-09-18 VITALS — BP 98/68 | Ht 64.0 in | Wt 170.0 lb

## 2024-09-18 DIAGNOSIS — E039 Hypothyroidism, unspecified: Secondary | ICD-10-CM

## 2024-09-18 DIAGNOSIS — Z23 Encounter for immunization: Secondary | ICD-10-CM

## 2024-09-18 DIAGNOSIS — R739 Hyperglycemia, unspecified: Secondary | ICD-10-CM | POA: Diagnosis not present

## 2024-09-18 DIAGNOSIS — Z6834 Body mass index (BMI) 34.0-34.9, adult: Secondary | ICD-10-CM

## 2024-09-18 DIAGNOSIS — Z6829 Body mass index (BMI) 29.0-29.9, adult: Secondary | ICD-10-CM

## 2024-09-18 DIAGNOSIS — Z136 Encounter for screening for cardiovascular disorders: Secondary | ICD-10-CM | POA: Diagnosis not present

## 2024-09-18 DIAGNOSIS — Z0001 Encounter for general adult medical examination with abnormal findings: Secondary | ICD-10-CM | POA: Diagnosis not present

## 2024-09-18 DIAGNOSIS — E66811 Obesity, class 1: Secondary | ICD-10-CM

## 2024-09-18 DIAGNOSIS — W57XXXA Bitten or stung by nonvenomous insect and other nonvenomous arthropods, initial encounter: Secondary | ICD-10-CM

## 2024-09-18 DIAGNOSIS — E663 Overweight: Secondary | ICD-10-CM

## 2024-09-18 DIAGNOSIS — E6609 Other obesity due to excess calories: Secondary | ICD-10-CM

## 2024-09-18 MED ORDER — PREMARIN 0.625 MG/GM VA CREA
TOPICAL_CREAM | VAGINAL | 5 refills | Status: AC
Start: 2024-09-18 — End: ?

## 2024-09-18 NOTE — Assessment & Plan Note (Signed)
 Encourage diet and exercise for weight loss

## 2024-09-18 NOTE — Patient Instructions (Signed)
 Health Maintenance for Postmenopausal Women Menopause is a normal process in which your ability to get pregnant comes to an end. This process happens slowly over many months or years, usually between the ages of 70 and 38. Menopause is complete when you have missed your menstrual period for 12 months. It is important to talk with your health care provider about some of the most common conditions that affect women after menopause (postmenopausal women). These include heart disease, cancer, and bone loss (osteoporosis). Adopting a healthy lifestyle and getting preventive care can help to promote your health and wellness. The actions you take can also lower your chances of developing some of these common conditions. What are the signs and symptoms of menopause? During menopause, you may have the following symptoms: Hot flashes. These can be moderate or severe. Night sweats. Decrease in sex drive. Mood swings. Headaches. Tiredness (fatigue). Irritability. Memory problems. Problems falling asleep or staying asleep. Talk with your health care provider about treatment options for your symptoms. Do I need hormone replacement therapy? Hormone replacement therapy is effective in treating symptoms that are caused by menopause, such as hot flashes and night sweats. Hormone replacement carries certain risks, especially as you become older. If you are thinking about using estrogen or estrogen with progestin, discuss the benefits and risks with your health care provider. How can I reduce my risk for heart disease and stroke? The risk of heart disease, heart attack, and stroke increases as you age. One of the causes may be a change in the body's hormones during menopause. This can affect how your body uses dietary fats, triglycerides, and cholesterol. Heart attack and stroke are medical emergencies. There are many things that you can do to help prevent heart disease and stroke. Watch your blood pressure High  blood pressure causes heart disease and increases the risk of stroke. This is more likely to develop in people who have high blood pressure readings or are overweight. Have your blood pressure checked: Every 3-5 years if you are 70-23 years of age. Every year if you are 70 years old or older. Eat a healthy diet  Eat a diet that includes plenty of vegetables, fruits, low-fat dairy products, and lean protein. Do not eat a lot of foods that are high in solid fats, added sugars, or sodium. Get regular exercise Get regular exercise. This is one of the most important things you can do for your health. Most adults should: Try to exercise for at least 150 minutes each week. The exercise should increase your heart rate and make you sweat (moderate-intensity exercise). Try to do strengthening exercises at least twice each week. Do these in addition to the moderate-intensity exercise. Spend less time sitting. Even light physical activity can be beneficial. Other tips Work with your health care provider to achieve or maintain a healthy weight. Do not use any products that contain nicotine or tobacco. These products include cigarettes, chewing tobacco, and vaping devices, such as e-cigarettes. If you need help quitting, ask your health care provider. Know your numbers. Ask your health care provider to check your cholesterol and your blood sugar (glucose). Continue to have your blood tested as directed by your health care provider. Do I need screening for cancer? Depending on your health history and family history, you may need to have cancer screenings at different stages of your life. This may include screening for: Breast cancer. Cervical cancer. Lung cancer. Colorectal cancer. What is my risk for osteoporosis? After menopause, you may be  at increased risk for osteoporosis. Osteoporosis is a condition in which bone destruction happens more quickly than new bone creation. To help prevent osteoporosis or  the bone fractures that can happen because of osteoporosis, you may take the following actions: If you are 70-54 years old, get at least 1,000 mg of calcium and at least 600 international units (IU) of vitamin D  per day. If you are older than age 70 but younger than age 30, get at least 1,200 mg of calcium and at least 600 international units (IU) of vitamin D  per day. If you are older than age 70, get at least 1,200 mg of calcium and at least 800 international units (IU) of vitamin D  per day. Smoking and drinking excessive alcohol increase the risk of osteoporosis. Eat foods that are rich in calcium and vitamin D , and do weight-bearing exercises several times each week as directed by your health care provider. How does menopause affect my mental health? Depression may occur at any age, but it is more common as you become older. Common symptoms of depression include: Feeling depressed. Changes in sleep patterns. Changes in appetite or eating patterns. Feeling an overall lack of motivation or enjoyment of activities that you previously enjoyed. Frequent crying spells. Talk with your health care provider if you think that you are experiencing any of these symptoms. General instructions See your health care provider for regular wellness exams and vaccines. This may include: Scheduling regular health, dental, and eye exams. Getting and maintaining your vaccines. These include: Influenza vaccine. Get this vaccine each year before the flu season begins. Pneumonia vaccine. Shingles vaccine. Tetanus, diphtheria, and pertussis (Tdap) booster vaccine. Your health care provider may also recommend other immunizations. Tell your health care provider if you have ever been abused or do not feel safe at home. Summary Menopause is a normal process in which your ability to get pregnant comes to an end. This condition causes hot flashes, night sweats, decreased interest in sex, mood swings, headaches, or lack  of sleep. Treatment for this condition may include hormone replacement therapy. Take actions to keep yourself healthy, including exercising regularly, eating a healthy diet, watching your weight, and checking your blood pressure and blood sugar levels. Get screened for cancer and depression. Make sure that you are up to date with all your vaccines. This information is not intended to replace advice given to you by your health care provider. Make sure you discuss any questions you have with your health care provider. Document Revised: 04/26/2021 Document Reviewed: 04/26/2021 Elsevier Patient Education  2024 [employer].

## 2024-09-18 NOTE — Progress Notes (Signed)
 Subjective:    Patient ID: Samantha Whitney, female    DOB: [birth year], 68 y.o.   MRN: [id number]  HPI  Patient presents to clinic today for her annual exam.  She would also like to be screened for Lyme disease.  She reports she has had multiple tick bites over the summer.  Flu: 08/2023 Tetanus: 02/2019 COVID: X 3 Prevnar 20: 09/2022 Shingrix : 02/2019, 10/2019 Pap smear: 02/2019 Mammogram: 08/2024 Bone density: 07/2022 Colon screening: 01/2022 Vision screening: annually Dentist: biannually  Diet: She does eat meat. She consumes fruits and veggies. She tries to avoid fried foods. She drinks mostly water, tea, coffee Exercise: Walking, yoga   Review of Systems     Past Medical History:  Diagnosis Date   Allergy    Bronchitis    GERD (gastroesophageal reflux disease)     Current Outpatient Medications  Medication Sig Dispense Refill   Calcium Carbonate-Vitamin D  (CALTRATE 600+D PO) Take 1 tablet by mouth daily.     CELEBREX 200 MG capsule Take 1 capsule every day by oral route with meal(s).     Cholecalciferol (VITAMIN D3 PO) Take by mouth.     clobetasol  (OLUX ) 0.05 % topical foam apply twice daily as needed to affected areas scalp/ears for two weeks then apply twice daily on weekends only, Avoid applying to face, groin, and axilla. Use as directed. Risk of skin atrophy with long-term use reviewed. 50 g 6   conjugated estrogens  (PREMARIN ) vaginal cream Use a fingertip amount at opening of vagina nightly for vaginal dryness. 42.5 g 5   esomeprazole  (NEXIUM ) 40 MG capsule TAKE 1 CAPSULE(40 MG) BY MOUTH DAILY 90 capsule 1   ketoconazole  (NIZORAL ) 2 % shampoo Shampoo into the scalp let sit 5-10 minutes then wash out. Use 3 days per week. 120 mL 6   levothyroxine  (SYNTHROID ) 25 MCG tablet TAKE 1 TABLET(25 MCG) BY MOUTH DAILY 90 tablet 2   loratadine (CLARITIN) 10 MG tablet Take 10 mg by mouth daily.     WEGOVY 1.7 MG/0.75ML SOAJ      No current facility-administered medications for  this visit.    Allergies  Allergen Reactions   Penicillins Hives   Scallops [Shellfish Allergy] Nausea And Vomiting and Rash    Family History  Problem Relation Age of Onset   Cancer Mother        ColoRectal   Cancer Father        Stomach Cancer    Social History   Socioeconomic History   Marital status: Married    Spouse name: Marcene   Number of children: 1   Years of education: Not on file   Highest education level: Bachelor's degree (e.g., BA, AB, BS)  Occupational History    Employer: [employer]  Tobacco Use   Smoking status: Never   Smokeless tobacco: Never  Vaping Use   Vaping status: Never Used  Substance and Sexual Activity   Alcohol use: Yes    Alcohol/week: 2.0 standard drinks of alcohol    Types: 2 Glasses of wine per week    Comment: weekly   Drug use: Never   Sexual activity: Not on file  Other Topics Concern   Not on file  Social History Narrative   Not on file   Social Drivers of Health   Financial Resource Strain: Low Risk  (09/08/2023)   Overall Financial Resource Strain (CARDIA)    Difficulty of Paying Living Expenses: Not hard at all  Food Insecurity: No Food Insecurity (09/08/2023)  Hunger Vital Sign    Worried About Running Out of Food in the Last Year: Never true    Ran Out of Food in the Last Year: Never true  Transportation Needs: No Transportation Needs (09/08/2023)   PRAPARE - Administrator, Civil Service (Medical): No    Lack of Transportation (Non-Medical): No  Physical Activity: Sufficiently Active (09/08/2023)   Exercise Vital Sign    Days of Exercise per Week: 6 days    Minutes of Exercise per Session: 40 min  Stress: No Stress Concern Present (09/08/2023)   [employer] of Occupational Health - Occupational Stress Questionnaire    Feeling of Stress : Only a little  Social Connections: Unknown (09/08/2023)   Social Connection and Isolation Panel    Frequency of Communication with Friends and Family: Three times  a week    Frequency of Social Gatherings with Friends and Family: More than three times a week    Attends Religious Services: Patient declined    Database administrator or Organizations: No    Attends Engineer, structural: Not on file    Marital Status: Married  Catering manager Violence: Not on file     Constitutional: Denies fever, malaise, fatigue, headache or abrupt weight changes.  HEENT: Denies eye pain, eye redness, ear pain, ringing in the ears, wax buildup, runny nose, nasal congestion, bloody nose, or sore throat. Respiratory:  Denies difficulty breathing, shortness of breath, cough or sputum production.   Cardiovascular: Denies chest pain, chest tightness, palpitations or swelling in the hands or feet.  Gastrointestinal: Pt reports constipation. Denies abdominal pain, bloating, diarrhea or blood in stool.  GU: Denies urgency, frequency, pain with urination, burning sensation, blood in urine, odor or discharge. Musculoskeletal: Pt reports intermittent hip pain. Denies decrease in range of motion, difficulty with gait, muscle pain or joint swelling.  Skin: Denies redness, rashes, lesions or ulcercations.  Neurological: Patient reports insomnia.  Denies dizziness, difficulty with memory, difficulty with speech or problems with balance and coordination.  Psych: Denies anxiety, depression, SI/HI.  No other specific complaints in a complete review of systems (except as listed in HPI above).  Objective:   Physical Exam BP 98/68 (BP Location: Left Arm, Patient Position: Sitting, Cuff Size: Normal)   Ht 5' 4 (1.626 m)   Wt 170 lb (77.1 kg)   BMI 29.18 kg/m    Wt Readings from Last 3 Encounters:  04/09/24 169 lb 9.6 oz (76.9 kg)  03/15/24 173 lb (78.5 kg)  09/08/23 183 lb (83 kg)    General: Appears her stated age, obese, in NAD. Skin: Warm, dry and intact. HEENT: Head: normal shape and size; Eyes: sclera white, no icterus, conjunctiva pink, PERRLA and EOMs intact;   Neck:  Neck supple, trachea midline. No masses, lumps or thyromegaly present.  Cardiovascular: Normal rate and rhythm. S1,S2 noted.  No murmur, rubs or gallops noted. No JVD or BLE edema. No carotid bruits noted. Pulmonary/Chest: Normal effort and coarse breath sounds. No respiratory distress. No wheezes, rales or ronchi noted.  Abdomen:  Normal bowel sounds.  Musculoskeletal: Strength 5/5 BUE/BLE. No difficulty with gait.  Neurological: Alert and oriented. Cranial nerves II-XII grossly intact. Coordination normal.  Psychiatric: Mood and affect normal. Behavior is normal. Judgment and thought content normal.    BMET    Component Value Date/Time   NA 140 03/28/2024 0839   K 4.5 03/28/2024 0839   CL 102 03/28/2024 0839   CO2 23 03/28/2024 0839  GLUCOSE 95 03/28/2024 0839   BUN 12 03/28/2024 0839   CREATININE 0.86 03/28/2024 0839   CALCIUM 9.2 03/28/2024 0839   GFRNONAA 79 07/08/2020 0916   GFRAA 91 07/08/2020 0916    Lipid Panel     Component Value Date/Time   CHOL 197 03/28/2024 0839   TRIG 120 03/28/2024 0839   HDL 68 03/28/2024 0839   CHOLHDL 2.9 03/28/2024 0839   LDLCALC 108 (H) 03/28/2024 0839    CBC    Component Value Date/Time   WBC 4.4 03/28/2024 0839   RBC 4.12 03/28/2024 0839   HGB 12.6 03/28/2024 0839   HCT 37.5 03/28/2024 0839   PLT 281 03/28/2024 0839   MCV 91 03/28/2024 0839   MCH 30.6 03/28/2024 0839   MCHC 33.6 03/28/2024 0839   RDW 12.2 03/28/2024 0839   LYMPHSABS 1.9 02/18/2022 0939   EOSABS 0.2 02/18/2022 0939   BASOSABS 0.1 02/18/2022 0939    Hgb A1C Lab Results  Component Value Date   HGBA1C 5.2 03/28/2024           Assessment & Plan:   Preventative health maintenance:  Flu shot today Tetanus UTD Encouraged her to get her COVID-vaccine Prevnar 20 UTD Shingrix  UTD She no longer needs to screen for cervical cancer Mammogram UTD Bone density UTD Colon screening UTD Encouraged her to consume a balanced diet and exercise  regimen Advised her to see an eye doctor and dentist annually We will check CBC, c-Met, TSH, free T4, lipid, A1c today  Multiple tick bites:  Will check Lyme serology with reflex  RTC in 6 months, follow-up chronic conditions Angeline Laura, NP

## 2024-09-28 ENCOUNTER — Other Ambulatory Visit: Payer: Self-pay | Admitting: Internal Medicine

## 2024-09-28 DIAGNOSIS — K219 Gastro-esophageal reflux disease without esophagitis: Secondary | ICD-10-CM

## 2024-10-01 NOTE — Telephone Encounter (Signed)
 Requested Prescriptions  Pending Prescriptions Disp Refills   esomeprazole  (NEXIUM ) 40 MG capsule [Pharmacy Med Name: ESOMEPRAZOLE  MAGNESIUM  40MG  DR CAPS] 90 capsule 1    Sig: TAKE 1 CAPSULE(40 MG) BY MOUTH DAILY     Gastroenterology: Proton Pump Inhibitors 2 Passed - 10/01/2024 12:53 PM      Passed - ALT in normal range and within 360 days    ALT  Date Value Ref Range Status  03/28/2024 12 0 - 32 IU/L Final         Passed - AST in normal range and within 360 days    AST  Date Value Ref Range Status  03/28/2024 19 0 - 40 IU/L Final         Passed - Valid encounter within last 12 months    Recent Outpatient Visits           1 week ago Encounter for general adult medical examination with abnormal findings   [facility] [place], Angeline ORN, NP   5 months ago Oral ulceration   [facility] [place], [place] W, NP   6 months ago Gastroesophageal reflux disease without esophagitis   [place] [facility] [place], Angeline ORN, NP       Future Appointments             In 10 months Claudene Lehmann, MD [facility]

## 2024-10-09 ENCOUNTER — Ambulatory Visit: Payer: Self-pay | Admitting: Internal Medicine

## 2024-10-09 LAB — LYME IGG/IGM
Lyme IgG EIA: NEGATIVE
Lyme IgM EIA: NEGATIVE

## 2024-10-09 LAB — HEMOGLOBIN A1C
Est. average glucose Bld gHb Est-mCnc: 108 mg/dL
Hgb A1c MFr Bld: 5.4 % (ref 4.8–5.6)

## 2024-10-09 LAB — CBC
Hematocrit: 36.8 % (ref 34.0–46.6)
Hemoglobin: 12 g/dL (ref 11.1–15.9)
MCH: 30 pg (ref 26.6–33.0)
MCHC: 32.6 g/dL (ref 31.5–35.7)
MCV: 92 fL (ref 79–97)
Platelets: 286 x10E3/uL (ref 150–450)
RBC: 4 x10E6/uL (ref 3.77–5.28)
RDW: 13.1 % (ref 11.7–15.4)
WBC: 6.4 x10E3/uL (ref 3.4–10.8)

## 2024-10-09 LAB — LIPID PANEL
Chol/HDL Ratio: 2.5 ratio (ref 0.0–4.4)
Cholesterol, Total: 220 mg/dL — ABNORMAL HIGH (ref 100–199)
HDL: 89 mg/dL (ref >39)
LDL Chol Calc (NIH): 113 mg/dL — ABNORMAL HIGH (ref 0–99)
Triglycerides: 104 mg/dL (ref 0–149)
VLDL Cholesterol Cal: 18 mg/dL (ref 5–40)

## 2024-10-09 LAB — COMPREHENSIVE METABOLIC PANEL WITH GFR
ALT: 11 IU/L (ref 0–32)
AST: 17 IU/L (ref 0–40)
Albumin: 4.5 g/dL (ref 3.9–4.9)
Alkaline Phosphatase: 61 IU/L (ref 49–135)
BUN/Creatinine Ratio: 15 (ref 12–28)
BUN: 14 mg/dL (ref 8–27)
Bilirubin Total: 0.3 mg/dL (ref 0.0–1.2)
CO2: 22 mmol/L (ref 20–29)
Calcium: 9.5 mg/dL (ref 8.7–10.3)
Chloride: 103 mmol/L (ref 96–106)
Creatinine, Ser: 0.91 mg/dL (ref 0.57–1.00)
Globulin, Total: 2.8 g/dL (ref 1.5–4.5)
Glucose: 89 mg/dL (ref 70–99)
Potassium: 5.1 mmol/L (ref 3.5–5.2)
Sodium: 138 mmol/L (ref 134–144)
Total Protein: 7.3 g/dL (ref 6.0–8.5)
eGFR: 68 mL/min/1.73 (ref >59)

## 2024-10-09 LAB — LYME DISEASE SEROLOGY W/REFLEX: Lyme Total Antibody EIA: POSITIVE

## 2024-10-09 LAB — T4, FREE: Free T4: 1.24 ng/dL (ref 0.82–1.77)

## 2024-10-09 LAB — TSH: TSH: 1.99 u[IU]/mL (ref 0.450–4.500)

## 2025-03-21 ENCOUNTER — Ambulatory Visit: Admitting: Internal Medicine

## 2025-07-28 ENCOUNTER — Ambulatory Visit: Admitting: Dermatology
